# Patient Record
Sex: Female | Born: 2000 | Race: White | Hispanic: No | Marital: Single | Smoking: Never smoker
Health system: Southern US, Community
[De-identification: ages and names within clinical notes are randomized; demographics above are authoritative.]

## PROBLEM LIST (undated history)

## (undated) DIAGNOSIS — R7303 Prediabetes: Secondary | ICD-10-CM

## (undated) DIAGNOSIS — M419 Scoliosis, unspecified: Secondary | ICD-10-CM

## (undated) DIAGNOSIS — K589 Irritable bowel syndrome without diarrhea: Secondary | ICD-10-CM

## (undated) HISTORY — DX: Prediabetes: R73.03

## (undated) HISTORY — PX: TONSILLECTOMY: SUR1361

---
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
## 2021-05-22 ENCOUNTER — Other Ambulatory Visit: Payer: Self-pay

## 2021-05-22 ENCOUNTER — Encounter: Payer: Self-pay | Admitting: Nurse Practitioner

## 2021-05-22 ENCOUNTER — Ambulatory Visit (INDEPENDENT_AMBULATORY_CARE_PROVIDER_SITE_OTHER): Payer: Medicaid Other | Admitting: Nurse Practitioner

## 2021-05-22 VITALS — BP 114/72 | HR 89 | Temp 97.7°F | Ht 63.0 in | Wt 287.1 lb

## 2021-05-22 DIAGNOSIS — R10811 Right upper quadrant abdominal tenderness: Secondary | ICD-10-CM | POA: Diagnosis not present

## 2021-05-22 DIAGNOSIS — B070 Plantar wart: Secondary | ICD-10-CM

## 2021-05-22 DIAGNOSIS — F321 Major depressive disorder, single episode, moderate: Secondary | ICD-10-CM

## 2021-05-22 DIAGNOSIS — Z7689 Persons encountering health services in other specified circumstances: Secondary | ICD-10-CM | POA: Insufficient documentation

## 2021-05-22 HISTORY — DX: Plantar wart: B07.0

## 2021-05-22 HISTORY — DX: Major depressive disorder, single episode, moderate: F32.1

## 2021-05-22 NOTE — Progress Notes (Signed)
New Patient Office Visit  Subjective:  Patient ID: Elizabeth [name], female    DOB: [birth year]  Age: 20 y.o. MRN: [id number]  CC:  Chief Complaint  Patient presents with   New Patient (Initial Visit)    HPI Elizabeth [name] presents to establish new primary care provider. She states that she has noted a growth on the inner part of her right foot. Getting larger and tender to palpate. Has tried using OTC remedies to remove plantar warts, but this has not helped at all. Growth is still getting larger and tender.  She states that, since Friday, has been having episodes or right upper quadrant abdominal tenderness. She describes this as sharp and stabbing in nature.  She states that being active walking around makes this pain worse.  Resting makes it better.  She states pain is severe and will double her over when it comes on.  She states she does have a known history of IBS.  She states that she has a flare of IBS, she has intestinal cramping in the lower part of her belly.  This is usually accompanied with frequent episodes of diarrhea.  On her right upper quadrant done which she is experienced in the past.  She denies nausea and vomiting.  She denies diarrhea patient.  Denies fever, body aches, or chills. The patient states that last year she was diagnosed with depression.  She does see a therapist once weekly.  States this helps some.  States that she will be seeing a psychiatrist in the near future for further evaluation.  Patient states she would like to avoid medication therapy for a long as possible. She denies other concerns or complaints at this time.  She denies chest pain, chest pressure, or shortness of breath. He denies headaches or visual disturbances. He denies abdominal pain, nausea, vomiting, or changes in bowel or bladder habits.    Past Medical History:  Diagnosis Date   Asthma     Past Surgical History:  Procedure Laterality Date   TYMPANOSTOMY TUBE PLACEMENT  07/2006    Bilateral myringotomy tubes per OP chart note.    Family History  Problem Relation Age of Onset   Asthma Mother    Asthma Maternal Uncle    Hypertension Maternal Grandmother    Cancer Maternal Grandmother    Asthma Maternal Grandmother    Heart attack Maternal Grandfather    Cancer Other        Breast    Social History   Socioeconomic History   Marital status: Single    Spouse name: Not on file   Number of children: Not on file   Years of education: Not on file   Highest education level: Not on file  Occupational History   Not on file  Tobacco Use   Smoking status: Never   Smokeless tobacco: Never  Substance and Sexual Activity   Alcohol use: [name]   Drug use: [name]   Sexual activity: Never  Other Topics Concern   Not on file  Social History Narrative   Not on file   Social Determinants of Health   Financial Resource Strain: Not on file  Food Insecurity: Not on file  Transportation Needs: Not on file  Physical Activity: Not on file  Stress: Not on file  Social Connections: Not on file  Intimate Partner Violence: Not on file    ROS Review of Systems  Constitutional:  Negative for activity change, chills, fatigue, fever and unexpected weight change.  HENT:  Negative for congestion, postnasal drip, rhinorrhea, sinus pressure and sinus pain.   Eyes: Negative.   Respiratory:  Negative for cough, chest tightness and shortness of breath.   Cardiovascular:  Negative for chest pain and palpitations.  Gastrointestinal:  Positive for abdominal pain. Negative for constipation, diarrhea, nausea and vomiting.  Endocrine: Negative for cold intolerance, heat intolerance, polydipsia and polyuria.  Genitourinary:  Positive for menstrual problem.  Musculoskeletal:  Negative for arthralgias, back pain and myalgias.  Skin:  Negative for rash.       Patient states she has a growth on the inner part of her right foot.  Getting larger.  Tender to palpate.  Allergic/Immunologic:  Negative.   Neurological:  Negative for dizziness, weakness and headaches.  Hematological: Negative.   Psychiatric/Behavioral:  Positive for dysphoric mood. The patient is not nervous/anxious.    Objective:   Today's Vitals   05/22/21 1020  BP: 114/72  Pulse: 89  Temp: 97.7 F (36.5 C)  SpO2: 97%  Weight: 287 lb 1.6 oz (130.2 kg)  Height: 5\' 3"  (1.6 m)   Body mass index is 50.86 kg/m.   Physical Exam Vitals and nursing note reviewed.  Constitutional:      Appearance: Normal appearance. She is well-developed. She is obese.  HENT:     Head: Normocephalic and atraumatic.  Eyes:     Pupils: Pupils are equal, round, and reactive to light.  Cardiovascular:     Rate and Rhythm: Normal rate and regular rhythm.     Pulses: Normal pulses.     Heart sounds: Normal heart sounds.  Pulmonary:     Effort: Pulmonary effort is normal.     Breath sounds: Normal breath sounds.  Abdominal:     General: Bowel sounds are normal.     Palpations: Abdomen is soft.     Tenderness: There is abdominal tenderness. There is [name] guarding.     Comments: [name] distinct mass or organomegaly present with deep palpation, however, there is an area that feels hypertensive with palpation in the right upper quadrant of abdomen.  It is tender to palpate.  Musculoskeletal:        General: Normal range of motion.     Cervical back: Normal range of motion and neck supple.  Lymphadenopathy:     Cervical: [name] cervical adenopathy.  Skin:    General: Skin is warm and dry.     Capillary Refill: Capillary refill takes less than 2 seconds.     Comments: There is moderately increased motion on the medial plantar aspect of the right foot.  Measures about 5 mm in diameter.  It is flesh-colored.  It is tender to palpate.  Skin is intact with [name] drainage present.  Neurological:     General: [name] focal deficit present.     Mental Status: She is alert and oriented to person, place, and time.  Psychiatric:        Mood and  Affect: Mood normal.        Behavior: Behavior normal.        Thought Content: Thought content normal.        Judgment: Judgment normal.    Assessment & Plan:  1. Encounter to establish care Appointment today to establish new primary care provider.  Will obtain records from pediatrics to review and update patient's chart.  2. Right upper quadrant abdominal tenderness without rebound tenderness Will obtain ultrasound right upper quadrant of the abdomen for further evaluation.  Gust results  with patient when results are available. - US Abdomen Limited RUQ (LIVER/GB); Future  3. Plantar wart Refer to podiatry for further evaluation and treatment. - Ambulatory referral to Podiatry  4. Episode of moderate major depression [facility]) Patient currently seeing a therapist once weekly.  Continue as scheduled.  Problem List Items Addressed This Visit       Musculoskeletal and Integument   Plantar wart   Relevant Orders   Ambulatory referral to Podiatry     Other   Encounter to establish care - Primary   Right upper quadrant abdominal tenderness without rebound tenderness   Relevant Orders   US Abdomen Limited RUQ (LIVER/GB)   Episode of moderate major depression (HCC)    Outpatient Encounter Medications as of 05/22/2021  Medication Sig   albuterol (VENTOLIN HFA) 108 (90 Base) MCG/ACT inhaler Inhale 2 puffs into the lungs every 6 (six) hours as needed.     [DISCONTINUED] Acetaminophen-Caff-Pyrilamine 500-60-15 MG TABS Take 2 tablets by mouth 2 (two) times daily as needed (For cramping.).   [DISCONTINUED] dicyclomine (BENTYL) 20 MG tablet Take 1 tablet (20 mg total) by mouth 2 (two) times daily.   [DISCONTINUED] norgestimate-ethinyl estradiol (ORTHO-CYCLEN,SPRINTEC,PREVIFEM) 0.25-35 MG-MCG tablet Take 1 tablet by mouth daily.   [name] facility-administered encounter medications on file as of 05/22/2021.   This note was dictated using Conservation officer, historic buildings. Rapid  proofreading was performed to expedite the delivery of the information. Despite proofreading, phonetic errors will occur which are common with this voice recognition software. Please take this into consideration. If there are any concerns, please contact our office.    Follow-up: Return in about 3 weeks (around 06/12/2021) for health maintenance exam - will need pelvic exam without pap smear - see below.   Carlean Jews, NP

## 2021-06-05 ENCOUNTER — Ambulatory Visit
Admission: RE | Admit: 2021-06-05 | Discharge: 2021-06-05 | Disposition: A | Discharge status: Home / Self Care | Payer: Medicaid Other | Source: Ambulatory Visit | Attending: Nurse Practitioner | Admitting: Nurse Practitioner

## 2021-06-05 DIAGNOSIS — R10811 Right upper quadrant abdominal tenderness: Secondary | ICD-10-CM

## 2021-06-12 ENCOUNTER — Telehealth: Payer: Self-pay | Admitting: Nurse Practitioner

## 2021-06-12 NOTE — Telephone Encounter (Signed)
Patient calling office requesting [place] results from 06/05/21. AS, CMA

## 2021-06-12 NOTE — Telephone Encounter (Signed)
Please let the patient know that the ultrasound is showing some fatty infiltration of the liver but was otherwise normal. There is nol evidence of gall stones or gall bladder disease right now. Thanks  -HB

## 2021-06-12 NOTE — Progress Notes (Signed)
Fatty infiltration of the liver without any acute gallbladder disease or liver lesions. Patient to be notified.

## 2021-06-13 NOTE — Telephone Encounter (Signed)
Patient is aware of the results and verbalized understanding. AS, CMA 

## 2021-06-19 ENCOUNTER — Encounter: Payer: Medicaid Other | Admitting: Nurse Practitioner

## 2021-06-22 ENCOUNTER — Ambulatory Visit: Payer: Medicaid Other | Admitting: Podiatry

## 2021-07-20 ENCOUNTER — Encounter: Payer: Medicaid Other | Admitting: Nurse Practitioner

## 2021-10-18 ENCOUNTER — Other Ambulatory Visit: Payer: Self-pay | Admitting: Nurse Practitioner

## 2021-10-18 ENCOUNTER — Encounter: Payer: Self-pay | Admitting: Nurse Practitioner

## 2021-10-18 ENCOUNTER — Ambulatory Visit
Admission: RE | Admit: 2021-10-18 | Discharge: 2021-10-18 | Disposition: A | Discharge status: Home / Self Care | Payer: Medicaid Other | Source: Ambulatory Visit | Attending: Nurse Practitioner | Admitting: Nurse Practitioner

## 2021-10-18 ENCOUNTER — Ambulatory Visit (INDEPENDENT_AMBULATORY_CARE_PROVIDER_SITE_OTHER): Payer: Medicaid Other | Admitting: Nurse Practitioner

## 2021-10-18 ENCOUNTER — Other Ambulatory Visit: Payer: Self-pay

## 2021-10-18 VITALS — BP 118/71 | HR 89 | Temp 97.5°F | Ht 63.0 in | Wt 280.1 lb

## 2021-10-18 DIAGNOSIS — N926 Irregular menstruation, unspecified: Secondary | ICD-10-CM | POA: Diagnosis present

## 2021-10-18 DIAGNOSIS — Z6841 Body Mass Index (BMI) 40.0 and over, adult: Secondary | ICD-10-CM | POA: Diagnosis not present

## 2021-10-18 DIAGNOSIS — O039 Complete or unspecified spontaneous abortion without complication: Secondary | ICD-10-CM

## 2021-10-18 LAB — POCT URINE PREGNANCY: Preg Test, Ur: NEGATIVE

## 2021-10-18 NOTE — Progress Notes (Signed)
Established Patient Office Visit  Subjective:  Patient ID: Elizabeth [name], female    DOB: [birth year]  Age: 21 y.o. MRN: [id number]  CC:  Chief Complaint  Patient presents with   Menstrual Problem    HPI Elizabeth [name] presents for possible pregnancy. States that she was stating to have nausea. This was happening a lot in the mornings and again in the evenings. She had itching sensation of the breasts. She states that she had a normal menstrual cycle in December. When she started noticing these symptoms, she did take two home pregnancy tests. Both were negative. She did have some spotting and then had a very light menstrual period from from January 3 through January 6 of this month. She states that period was not due until January 8. Usually menstrual cycles are a lot longer than this. She did take another home pregnancy test on January 8 which was positive. Took one on the 9th which was also positive. She took a third one on January 10th which was then negative. The patient states that she is not trying to get pregnant. She and her partner use condoms every time they have intercourse and has had no problems with them as far as she knows.  Today, she is having some lower abdominal pain along with cramps. She does have IBS and this is difficult to discern difference between cramping in the uterus vs. Intestinal.   Past Medical History:  Diagnosis Date   Asthma     Past Surgical History:  Procedure Laterality Date   TYMPANOSTOMY TUBE PLACEMENT  07/2006   Bilateral myringotomy tubes per OP chart note.    Family History  Problem Relation Age of Onset   Asthma Mother    Asthma Maternal Uncle    Hypertension Maternal Grandmother    Cancer Maternal Grandmother    Asthma Maternal Grandmother    Heart attack Maternal Grandfather    Cancer Other        Breast    Social History   Socioeconomic History   Marital status: Single    Spouse name: Not on file   Number of children: Not on  file   Years of education: Not on file   Highest education level: Not on file  Occupational History   Not on file  Tobacco Use   Smoking status: Never   Smokeless tobacco: Never  Substance and Sexual Activity   Alcohol use: No   Drug use: No   Sexual activity: Never  Other Topics Concern   Not on file  Social History Narrative   Not on file   Social Determinants of Health   Financial Resource Strain: Not on file  Food Insecurity: Not on file  Transportation Needs: Not on file  Physical Activity: Not on file  Stress: Not on file  Social Connections: Not on file  Intimate Partner Violence: Not on file    Outpatient Medications Prior to Visit  Medication Sig Dispense Refill   albuterol (VENTOLIN HFA) 108 (90 Base) MCG/ACT inhaler Inhale 2 puffs into the lungs every 6 (six) hours as needed.       No facility-administered medications prior to visit.    No Known Allergies  ROS Review of Systems  Constitutional:  Positive for fatigue. Negative for activity change, appetite change, chills and fever.  HENT:  Negative for congestion, postnasal drip, rhinorrhea, sinus pressure, sinus pain, sneezing and sore throat.   Eyes: Negative.   Respiratory:  Negative for cough, chest tightness,  shortness of breath and wheezing.   Cardiovascular:  Negative for chest pain and palpitations.  Gastrointestinal:  Positive for nausea. Negative for abdominal pain, constipation, diarrhea and vomiting.       Abdominal cramping.   Endocrine: Negative for cold intolerance, heat intolerance, polydipsia and polyuria.  Genitourinary:  Positive for menstrual problem. Negative for dyspareunia, dysuria, flank pain, frequency and urgency.       Had two positive home pregnancy tests on January 8 and January 9. Had first negative test January 10.  Musculoskeletal:  Negative for arthralgias, back pain and myalgias.  Skin:  Negative for rash.  Allergic/Immunologic: Negative for environmental allergies.   Neurological:  Negative for dizziness, weakness and headaches.  Hematological:  Negative for adenopathy.  Psychiatric/Behavioral:  The patient is not nervous/anxious.      Objective:    Physical Exam Vitals and nursing note reviewed.  Constitutional:      Appearance: Normal appearance. She is well-developed.  HENT:     Head: Normocephalic and atraumatic.     Nose: Nose normal.     Mouth/Throat:     Mouth: Mucous membranes are moist.  Eyes:     Extraocular Movements: Extraocular movements intact.     Conjunctiva/sclera: Conjunctivae normal.     Pupils: Pupils are equal, round, and reactive to light.  Cardiovascular:     Rate and Rhythm: Normal rate and regular rhythm.     Pulses: Normal pulses.     Heart sounds: Normal heart sounds.  Pulmonary:     Effort: Pulmonary effort is normal.     Breath sounds: Normal breath sounds.  Abdominal:     Palpations: Abdomen is soft.  Genitourinary:    Comments: Two urine pregnancy tests run I the office today and both were negative.  Musculoskeletal:        General: Normal range of motion.     Cervical back: Normal range of motion and neck supple.  Lymphadenopathy:     Cervical: No cervical adenopathy.  Skin:    General: Skin is warm and dry.     Capillary Refill: Capillary refill takes less than 2 seconds.  Neurological:     General: No focal deficit present.     Mental Status: She is alert and oriented to person, place, and time.  Psychiatric:        Mood and Affect: Mood normal.        Behavior: Behavior normal.        Thought Content: Thought content normal.        Judgment: Judgment normal.    Today's Vitals   10/18/21 1012  BP: 118/71  Pulse: 89  Temp: (!) 97.5 F (36.4 C)  SpO2: 97%  Weight: 280 lb 1.6 oz (127.1 kg)  Height: 5\' 3"  (1.6 m)   Body mass index is 49.62 kg/m.   Wt Readings from Last 3 Encounters:  10/18/21 280 lb 1.6 oz (127.1 kg) (>99 %, Z= 2.73)*  05/22/21 287 lb 1.6 oz (130.2 kg) (>99 %, Z=  2.74)*  01/05/16 197 lb 4.8 oz (89.5 kg) (99 %, Z= 2.32)*   * Growth percentiles are based on CDC (Girls, 2-20 Years) data.     Health Maintenance Due  Topic Date Due   COVID-19 Vaccine (1) Never done   HIV Screening  Never done   Hepatitis C Screening  Never done   INFLUENZA VACCINE  Never done    There are no preventive care reminders to display for this patient.  No  results found for: TSH Lab Results  Component Value Date   WBC 8.3 02/28/2015   HGB 14.3 02/28/2015   HCT 41.7 02/28/2015   MCV 86.2 02/28/2015   PLT 329 02/28/2015   Lab Results  Component Value Date   NA 139 02/28/2015   K 4.1 02/28/2015   CO2 27 02/28/2015   GLUCOSE 106 (H) 02/28/2015   BUN 10 02/28/2015   CREATININE 0.64 02/28/2015   BILITOT 0.4 02/28/2015   ALKPHOS 149 02/28/2015   AST 19 02/28/2015   ALT 16 02/28/2015   PROT 7.2 02/28/2015   ALBUMIN 4.2 02/28/2015   CALCIUM 9.2 02/28/2015   ANIONGAP 5 02/28/2015     Assessment & Plan:  1. Irregular periods/menstrual cycles Patient with abnormal menstrual period this month. Urine pregnancy test today is negative . - POCT Pregnancy, Urine  2. Miscarriage at 8 to [redacted] weeks gestation Patient's last normal menstrual period wsa early December. Had very light and short menstrual period in January. Had two positive pregnancy tests on January 8 and 9. Had negative test January 10. Today's pregnancy test is negative. Concern for miscarriage. Will get STAT ultrasound ordered for further evaluation. Will refer to GYN as indicated.  - US Pelvic Complete With Transvaginal; Future  3. Body mass index (BMI) of 45.0-49.9 in adult [facility]) Discussed lowering calorie intake to 1500 calories per day and incorporating exercise into daily routine to help lose weight.   Problem List Items Addressed This Visit       Other   Irregular periods/menstrual cycles - Primary   Relevant Orders   POCT Pregnancy, Urine   US OB LESS THAN 14 WEEKS WITH OB TRANSVAGINAL    Body mass index (BMI) of 45.0-49.9 in adult [facility])   Miscarriage at 8 to [redacted] weeks gestation   Relevant Orders   US OB LESS THAN 14 WEEKS WITH OB TRANSVAGINAL    Follow-up: Return for prn worsening or persistent symptoms.    Carlean Jews, NP

## 2021-10-18 NOTE — Progress Notes (Signed)
Spoke with patient over the phone regarding ultrasound results. Results were normal and showing no evidence of retained products of conception. She would like to consult with OB/GYN provider. A referral was placed today. She would also like to start on oral contraceptives. She is going to research a few different options and will contact the office with OCP she would like to try.

## 2021-10-18 NOTE — Progress Notes (Signed)
Spoke with patient over the phone regarding ultrasound results. Results were normal and showing no evidence of retained products of conception. She would like to consult with OB/GYN provider. A referral was placed today. She would also like to start on oral contraceptives. She is going to research a few different options and will contact the office with OCP she would like to try.

## 2021-10-18 NOTE — Patient Instructions (Signed)
Fat and Cholesterol Restricted Eating Plan Getting too much fat and cholesterol in your diet may cause health problems. Choosing the right foods helps keep your fat and cholesterol at normal levels. This can keep you from getting certain diseases. Your doctor may recommend an eating plan that includes: Total fat: ______% or less of total calories a day. This is ______g of fat a day. Saturated fat: ______% or less of total calories a day. This is ______g of saturated fat a day. Cholesterol: less than _________mg a day. Fiber: ______g a day. What are tips for following this plan? General tips Work with your doctor to lose weight if you need to. Avoid: Foods with added sugar. Fried foods. Foods with trans fat or partially hydrogenated oils. This includes some margarines and baked goods. If you drink alcohol: Limit how much you have to: 0-1 drink a day for women who are not pregnant. 0-2 drinks a day for men. Know how much alcohol is in a drink. In the [place] one drink equals one 12 oz bottle of beer (355 mL), one 5 oz glass of wine (148 mL), or one 1 oz glass of hard liquor (44 mL). Reading food labels Check food labels for: Trans fats. Partially hydrogenated oils. Saturated fat (g) in each serving. Cholesterol (mg) in each serving. Fiber (g) in each serving. Choose foods with healthy fats, such as: Monounsaturated fats and polyunsaturated fats. These include olive and canola oil, flaxseeds, walnuts, almonds, and seeds. Omega-3 fats. These are found in certain fish, flaxseed oil, and ground flaxseeds. Choose grain products that have whole grains. Look for the word "whole" as the first word in the ingredient list. Cooking Cook foods using low-fat methods. These include baking, boiling, grilling, and broiling. Eat more home-cooked foods. Eat at restaurants and buffets less often. Eat less fast food. Avoid cooking using saturated fats, such as butter, cream, palm oil, palm kernel oil, and  coconut oil. Meal planning  At meals, divide your plate into four equal parts: Fill one-half of your plate with vegetables, green salads, and fruit. Fill one-fourth of your plate with whole grains. Fill one-fourth of your plate with low-fat (lean) protein foods. Eat fish that is high in omega-3 fats at least two times a week. This includes mackerel, tuna, sardines, and salmon. Eat foods that are high in fiber, such as whole grains, beans, apples, pears, berries, broccoli, carrots, peas, and barley. What foods should I eat? Fruits All fresh, canned (in natural juice), or frozen fruits. Vegetables Fresh or frozen vegetables (raw, steamed, roasted, or grilled). Green salads. Grains Whole grains, such as whole wheat or whole grain breads, crackers, cereals, and pasta. Unsweetened oatmeal, bulgur, barley, quinoa, or [place] rice. Corn or whole wheat flour tortillas. Meats and other protein foods Ground beef (85% or leaner), grass-fed beef, or beef trimmed of fat. Skinless chicken or turkey. Ground chicken or turkey. Pork trimmed of fat. All fish and seafood. Egg whites. Dried beans, peas, or lentils. Unsalted nuts or seeds. Unsalted canned beans. Nut butters without added sugar or oil. Dairy Low-fat or nonfat dairy products, such as skim or 1% milk, 2% or reduced-fat cheeses, low-fat and fat-free ricotta or cottage cheese, or plain low-fat and nonfat yogurt. Fats and oils Tub margarine without trans fats. Light or reduced-fat mayonnaise and salad dressings. Avocado. Olive, canola, sesame, or safflower oils. The items listed above may not be a complete list of foods and beverages you can eat. Contact a dietitian for more information. What foods   should I avoid? Fruits Canned fruit in heavy syrup. Fruit in cream or butter sauce. Fried fruit. Vegetables Vegetables cooked in cheese, cream, or butter sauce. Fried vegetables. Grains White bread. White pasta. White rice. Cornbread. Bagels, pastries,  and croissants. Crackers and snack foods that contain trans fat and hydrogenated oils. Meats and other protein foods Fatty cuts of meat. Ribs, chicken wings, bacon, sausage, bologna, salami, chitterlings, fatback, hot dogs, bratwurst, and packaged lunch meats. Liver and organ meats. Whole eggs and egg yolks. Chicken and turkey with skin. Fried meat. Dairy Whole or 2% milk, cream, half-and-half, and cream cheese. Whole milk cheeses. Whole-fat or sweetened yogurt. Full-fat cheeses. Nondairy creamers and whipped toppings. Processed cheese, cheese spreads, and cheese curds. Fats and oils Butter, stick margarine, lard, shortening, ghee, or bacon fat. Coconut, palm kernel, and palm oils. Beverages Alcohol. Sugar-sweetened drinks such as sodas, lemonade, and fruit drinks. Sweets and desserts Corn syrup, sugars, honey, and molasses. Candy. Jam and jelly. Syrup. Sweetened cereals. Cookies, pies, cakes, donuts, muffins, and ice cream. The items listed above may not be a complete list of foods and beverages you should avoid. Contact a dietitian for more information. Summary Choosing the right foods helps keep your fat and cholesterol at normal levels. This can keep you from getting certain diseases. At meals, fill one-half of your plate with vegetables, green salads, and fruits. Eat high fiber foods, like whole grains, beans, apples, pears, berries, carrots, peas, and barley. Limit added sugar, saturated fats, alcohol, and fried foods. This information is not intended to replace advice given to you by your health care provider. Make sure you discuss any questions you have with your health care provider. Document Revised: 02/02/2021 Document Reviewed: 02/02/2021 Elsevier Patient Education  2022 [employer]  

## 2021-12-17 ENCOUNTER — Encounter: Payer: Self-pay | Admitting: Nurse Practitioner

## 2021-12-17 ENCOUNTER — Other Ambulatory Visit: Payer: Self-pay

## 2021-12-17 ENCOUNTER — Ambulatory Visit (INDEPENDENT_AMBULATORY_CARE_PROVIDER_SITE_OTHER): Payer: Medicaid Other | Admitting: Nurse Practitioner

## 2021-12-17 VITALS — BP 107/66 | HR 75 | Temp 97.6°F | Ht 63.0 in | Wt 282.5 lb

## 2021-12-17 DIAGNOSIS — M4155 Other secondary scoliosis, thoracolumbar region: Secondary | ICD-10-CM | POA: Insufficient documentation

## 2021-12-17 DIAGNOSIS — Z6841 Body Mass Index (BMI) 40.0 and over, adult: Secondary | ICD-10-CM

## 2021-12-17 HISTORY — DX: Other secondary scoliosis, thoracolumbar region: M41.55

## 2021-12-17 NOTE — Progress Notes (Signed)
Established patient visit ? ? ?Patient: Elizabeth [name]   DOB: [birth year]   21 y.o. Female  MRN: [id number] ?Visit Date: 12/17/2021 ? ? ?No chief complaint on file. ? ?Subjective  ?  ?HPI  ?The patient is here for follow up of scoliosis. The curve of her upper spine, moves toward the right shoulder blade and causes her to feel pressure and pain in her right shoulder. She has consulted with orthopedic provider in the past for this and was approved for light duty work assignments, lifting no more than 20 pounds. She accepted a job with [employer]. 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 This often includes lifting packages which are heavier than 20 pounds. She started this job less than a week ago and already, she is having increased pain in her right shoulder. 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  ? ? ?Medications: ?Outpatient Medications Prior to Visit  ?Medication Sig  ? albuterol (VENTOLIN HFA) 108 (90 Base) MCG/ACT inhaler Inhale 2 puffs into the lungs every 6 (six) hours as needed.    ? ?No facility-administered medications prior to visit.  ? ? ?Review of Systems  ?Constitutional:  Positive for activity change and fatigue. Negative for appetite change, chills and fever.  ?HENT:  Negative for congestion, postnasal drip, rhinorrhea, sinus pressure, sinus pain, sneezing and sore throat.   ?Eyes: Negative.   ?Respiratory:  Negative for cough, chest tightness, shortness of breath and wheezing.   ?Cardiovascular:  Negative for chest pain and palpitations.  ?Gastrointestinal:  Negative for abdominal pain, constipation, diarrhea, nausea and vomiting.  ?Endocrine: Negative for cold intolerance, heat intolerance, polydipsia and polyuria.  ?Genitourinary:  Negative for dyspareunia, dysuria, flank pain,  frequency and urgency.  ?Musculoskeletal:  Positive for back pain. Negative for arthralgias and myalgias.  ?     Upper back pain which radiates into the right shoulder blade. Muscles are very sore.   ?Skin:  Negative for rash.  ?Allergic/Immunologic: Negative for environmental allergies.  ?Neurological:  Negative for dizziness, weakness and headaches.  ?Hematological:  Negative for adenopathy.  ?Psychiatric/Behavioral:  The patient is not nervous/anxious.   ? ? ? ? Objective  ?  ? ?Today's Vitals  ? 12/17/21 1430  ?BP: 107/66  ?Pulse: 75  ?Temp: 97.6 ?F (36.4 ?C)  ?SpO2: 97%  ?Weight: 282 lb 8 oz (128.1 kg)  ?Height: 5\' 3"  (1.6 m)  ? ?Body mass index is 50.04 kg/m?.  ? ?BP Readings from Last 3 Encounters:  ?12/17/21 107/66  ?10/18/21 118/71  ?05/22/21 114/72  ?  ?Wt Readings from Last 3 Encounters:  ?12/17/21 282 lb 8 oz (128.1 kg) (>99 %, Z= 2.76)*  ?10/18/21 280 lb 1.6 oz (127.1 kg) (>99 %, Z= 2.73)*  ?05/22/21 287 lb 1.6 oz (130.2 kg) (>99 %, Z= 2.74)*  ? ?* Growth percentiles are based on CDC (Girls, 2-20 Years) data.  ?  ?Physical Exam ?Vitals and nursing note reviewed.  ?Constitutional:   ?   Appearance: Normal appearance. She is well-developed. She is obese.  ?HENT:  ?   Head: Normocephalic and atraumatic.  ?Eyes:  ?   Pupils: Pupils are equal, round, and reactive to light.  ?Cardiovascular:  ?   Rate and Rhythm: Normal rate and regular rhythm.  ?  Pulses: Normal pulses.  ?   Heart sounds: Normal heart sounds.  ?Pulmonary:  ?   Effort: Pulmonary effort is normal.  ?   Breath sounds: Normal breath sounds.  ?Abdominal:  ?   Palpations: Abdomen is soft.  ?Musculoskeletal:  ?   Cervical back: Normal range of motion and neck supple.  ?   Thoracic back: Deformity present. Decreased range of motion.  ?     Back: ? ?Lymphadenopathy:  ?   Cervical: No cervical adenopathy.  ?Skin: ?   General: Skin is warm and dry.  ?   Capillary Refill: Capillary refill takes less than 2 seconds.  ?Neurological:  ?   General: No  focal deficit present.  ?   Mental Status: She is alert and oriented to person, place, and time.  ?Psychiatric:     ?   Mood and Affect: Mood normal.     ?   Behavior: Behavior normal.     ?   Thought Content: Thought content normal.     ?   Judgment: Judgment normal.  ?  ? ? Assessment & Plan  ?  ?1. Other secondary scoliosis, thoracolumbar region ?The patient was given a work note requesting accommodations due to scoliosis which is limiting her ability to lift greater than 20 pounds.  ? ?2. Body mass index (BMI) of 50-59.9 in adult [facility]) ?Discussed lowering calorie intake to 1500 calories per day and incorporating exercise into daily routine to help lose weight.  ?  ?Problem List Items Addressed This Visit   ? ?  ? Musculoskeletal and Integument  ? Other secondary scoliosis, thoracolumbar region - Primary  ?  ? Other  ? Body mass index (BMI) of 50-59.9 in adult [facility])  ?  ? ?Return in about 6 months (around 06/19/2022) for health maintenance exam.  ?   ? ? ? ? ?Ronnell Freshwater, NP  ?[place] [facility] ?[phone] (phone) ?[phone] (fax) ? ?[facility]  ?

## 2022-01-17 ENCOUNTER — Ambulatory Visit: Payer: Medicaid Other

## 2022-01-26 ENCOUNTER — Telehealth: Payer: Medicaid Other | Admitting: Physician Assistant

## 2022-01-26 DIAGNOSIS — J029 Acute pharyngitis, unspecified: Secondary | ICD-10-CM

## 2022-01-26 MED ORDER — AMOXICILLIN 500 MG PO CAPS: 500.0000 mg | ORAL_CAPSULE | Freq: Two times a day (BID) | ORAL | 0 refills | Status: AC

## 2022-01-26 MED ORDER — AMOXICILLIN 500 MG PO CAPS: 500.0000 mg | ORAL_CAPSULE | Freq: Two times a day (BID) | ORAL | 0 refills | Status: DC

## 2022-01-26 NOTE — Progress Notes (Signed)

## 2022-01-26 NOTE — Addendum Note (Signed)
Addended by: Haynes Bast on: 01/26/2022 05:39 PM ? ? Modules accepted: Orders ? ?

## 2022-02-07 DIAGNOSIS — J45909 Unspecified asthma, uncomplicated: Secondary | ICD-10-CM | POA: Insufficient documentation

## 2022-02-07 DIAGNOSIS — R1011 Right upper quadrant pain: Secondary | ICD-10-CM | POA: Diagnosis present

## 2022-02-07 DIAGNOSIS — N9489 Other specified conditions associated with female genital organs and menstrual cycle: Secondary | ICD-10-CM | POA: Diagnosis not present

## 2022-02-07 DIAGNOSIS — N390 Urinary tract infection, site not specified: Secondary | ICD-10-CM | POA: Diagnosis not present

## 2022-02-07 DIAGNOSIS — B9689 Other specified bacterial agents as the cause of diseases classified elsewhere: Secondary | ICD-10-CM | POA: Insufficient documentation

## 2022-02-08 ENCOUNTER — Emergency Department (HOSPITAL_COMMUNITY)
Admission: EM | Admit: 2022-02-08 | Discharge: 2022-02-08 | Disposition: A | Discharge status: Home / Self Care | Payer: Medicaid Other | Attending: Emergency Medicine | Admitting: Emergency Medicine

## 2022-02-08 ENCOUNTER — Encounter (HOSPITAL_COMMUNITY): Payer: Self-pay

## 2022-02-08 ENCOUNTER — Emergency Department (HOSPITAL_COMMUNITY): Payer: Medicaid Other

## 2022-02-08 ENCOUNTER — Other Ambulatory Visit: Payer: Self-pay

## 2022-02-08 DIAGNOSIS — R1011 Right upper quadrant pain: Secondary | ICD-10-CM

## 2022-02-08 DIAGNOSIS — N390 Urinary tract infection, site not specified: Secondary | ICD-10-CM

## 2022-02-08 HISTORY — DX: Scoliosis, unspecified: M41.9

## 2022-02-08 LAB — CBC WITH DIFFERENTIAL/PLATELET
Abs Immature Granulocytes: 0.03 10*3/uL (ref 0.00–0.07)
Basophils Absolute: 0.1 10*3/uL (ref 0.0–0.1)
Basophils Relative: 1 %
Eosinophils Absolute: 0.1 10*3/uL (ref 0.0–0.5)
Eosinophils Relative: 1 %
HCT: 41.8 % (ref 36.0–46.0)
Hemoglobin: 14.3 g/dL (ref 12.0–15.0)
Immature Granulocytes: 0 %
Lymphocytes Relative: 28 %
Lymphs Abs: 2.4 10*3/uL (ref 0.7–4.0)
MCH: 30.7 pg (ref 26.0–34.0)
MCHC: 34.2 g/dL (ref 30.0–36.0)
MCV: 89.7 fL (ref 80.0–100.0)
Monocytes Absolute: 0.4 10*3/uL (ref 0.1–1.0)
Monocytes Relative: 5 %
Neutro Abs: 5.5 10*3/uL (ref 1.7–7.7)
Neutrophils Relative %: 65 %
Platelets: 329 10*3/uL (ref 150–400)
RBC: 4.66 MIL/uL (ref 3.87–5.11)
RDW: 12.7 % (ref 11.5–15.5)
WBC: 8.5 10*3/uL (ref 4.0–10.5)
nRBC: 0 % (ref 0.0–0.2)

## 2022-02-08 LAB — COMPREHENSIVE METABOLIC PANEL
ALT: 21 U/L (ref 0–44)
AST: 22 U/L (ref 15–41)
Albumin: 4 g/dL (ref 3.5–5.0)
Alkaline Phosphatase: 76 U/L (ref 38–126)
Anion gap: 7 (ref 5–15)
BUN: 15 mg/dL (ref 6–20)
CO2: 25 mmol/L (ref 22–32)
Calcium: 9 mg/dL (ref 8.9–10.3)
Chloride: 108 mmol/L (ref 98–111)
Creatinine, Ser: 1.01 mg/dL — ABNORMAL HIGH (ref 0.44–1.00)
GFR, Estimated: >60 mL/min (ref >60)
Glucose, Bld: 96 mg/dL (ref 70–99)
Potassium: 4.1 mmol/L (ref 3.5–5.1)
Sodium: 140 mmol/L (ref 135–145)
Total Bilirubin: 0.6 mg/dL (ref 0.3–1.2)
Total Protein: 7.1 g/dL (ref 6.5–8.1)

## 2022-02-08 LAB — URINALYSIS, ROUTINE W REFLEX MICROSCOPIC
Bilirubin Urine: NEGATIVE
Glucose, UA: NEGATIVE mg/dL
Hgb urine dipstick: NEGATIVE
Ketones, ur: NEGATIVE mg/dL
Nitrite: POSITIVE — AB
Protein, ur: NEGATIVE mg/dL
Specific Gravity, Urine: 1.019 (ref 1.005–1.030)
pH: 7 (ref 5.0–8.0)

## 2022-02-08 LAB — LIPASE, BLOOD: Lipase: 32 U/L (ref 11–51)

## 2022-02-08 LAB — I-STAT BETA HCG BLOOD, ED (MC, WL, AP ONLY): I-stat hCG, quantitative: <5 m[IU]/mL (ref <5)

## 2022-02-08 MED ORDER — CEPHALEXIN 500 MG PO CAPS: 500.0000 mg | ORAL_CAPSULE | Freq: Four times a day (QID) | ORAL | 0 refills | Status: DC

## 2022-02-08 MED ORDER — CEPHALEXIN 500 MG PO CAPS
500.0000 mg | ORAL_CAPSULE | Freq: Once | ORAL | Status: AC
  Administered 2022-02-08: 500 mg via ORAL
  Filled 2022-02-08: qty 1

## 2022-02-08 MED ORDER — KETOROLAC TROMETHAMINE 30 MG/ML IJ SOLN
15.0000 mg | Freq: Once | INTRAMUSCULAR | Status: AC
  Administered 2022-02-08: 15 mg via INTRAVENOUS
  Filled 2022-02-08: qty 1

## 2022-02-08 MED ORDER — DICYCLOMINE HCL 10 MG PO CAPS
10.0000 mg | ORAL_CAPSULE | Freq: Once | ORAL | Status: AC
Start: 2022-02-08 — End: 2022-02-08
  Administered 2022-02-08: 10 mg via ORAL
  Filled 2022-02-08: qty 1

## 2022-02-08 MED ORDER — SODIUM CHLORIDE (PF) 0.9 % IJ SOLN
INTRAMUSCULAR | Status: AC
  Filled 2022-02-08: qty 50

## 2022-02-08 MED ORDER — SODIUM CHLORIDE 0.9 % IV BOLUS
500.0000 mL | Freq: Once | INTRAVENOUS | Status: AC
  Administered 2022-02-08: 500 mL via INTRAVENOUS

## 2022-02-08 MED ORDER — IOHEXOL 300 MG/ML  SOLN
100.0000 mL | Freq: Once | INTRAMUSCULAR | Status: AC | PRN
  Administered 2022-02-08: 100 mL via INTRAVENOUS

## 2022-02-08 NOTE — ED Triage Notes (Signed)
Patient has had right lower abdominal pain for the past 3 hours. She said it feels like its under her ribs. No vomiting. Still has her appendix.  ?

## 2022-02-08 NOTE — ED Provider Notes (Signed)
?Elizabeth [name] DEPT ?Provider Note ? ? ?CSN: [id number] ?Arrival date & time: 02/07/22  2359 ? ?  ? ?History ? ?Chief Complaint  ?Patient presents with  ? Abdominal Pain  ? ? ?Elizabeth [name] is a 21 y.o. female. ? ?The history is provided by the patient.  ?Abdominal Pain ?Elizabeth [name] is a 21 y.o. female who presents to the Emergency Department complaining of abdominal pain. She presents to the ED for evaluation of RUQ abdominal pain that started at 6pm.  Pain is sharp and waxes and wanes, nonradiating.  No clear alleviating or worsening factors.   ? ?No fever, sob, N/V/D, dysuria, vaginal discharge.  .   ? ?Has a hx/o asthma.  No prior abdominal surgeries.  LMP 4/28.   ?No prior similar sxs.   ?  ? ?Home Medications ?Prior to Admission medications   ?Medication Sig Start Date End Date Taking? Authorizing Provider  ?cephALEXin (KEFLEX) 500 MG capsule Take 1 capsule (500 mg total) by mouth 4 (four) times daily. 02/08/22  Yes Quintella Reichert, MD  ?albuterol (VENTOLIN HFA) 108 (90 Base) MCG/ACT inhaler Inhale 2 puffs into the lungs every 6 (six) hours as needed.      [provider]  ?   ? ?Allergies    ?Patient has no known allergies.   ? ?Review of Systems   ?Review of Systems  ?Gastrointestinal:  Positive for abdominal pain.  ?All other systems reviewed and are negative. ? ?Physical Exam ?Updated Vital Signs ?BP (!) 142/75   Pulse 91   Temp 97.9 ?F (36.6 ?C) (Oral)   Resp 20   Ht 5\' 3"  (1.6 m)   Wt 127 kg   SpO2 100%   BMI 49.60 kg/m?  ?Physical Exam ?Vitals and nursing note reviewed.  ?Constitutional:   ?   Appearance: She is well-developed.  ?HENT:  ?   Head: Normocephalic and atraumatic.  ?Cardiovascular:  ?   Rate and Rhythm: Normal rate and regular rhythm.  ?Pulmonary:  ?   Effort: Pulmonary effort is normal. No respiratory distress.  ?Abdominal:  ?   Palpations: Abdomen is soft.  ?   Tenderness: There is no guarding or rebound.  ?   Comments: Mild RUQ tenderness   ?Musculoskeletal:     ?   General: No tenderness.  ?Skin: ?   General: Skin is warm and dry.  ?Neurological:  ?   Mental Status: She is alert and oriented to person, place, and time.  ?Psychiatric:     ?   Behavior: Behavior normal.  ? ? ?ED Results / Procedures / Treatments   ?Labs ?(all labs ordered are listed, but only abnormal results are displayed) ?Labs Reviewed  ?COMPREHENSIVE METABOLIC PANEL - Abnormal; Notable for the following components:  ?    Result Value  ? Creatinine, Ser 1.01 (*)   ? All other components within normal limits  ?URINALYSIS, ROUTINE W REFLEX MICROSCOPIC - Abnormal; Notable for the following components:  ? APPearance HAZY (*)   ? Nitrite POSITIVE (*)   ? Leukocytes,Ua TRACE (*)   ? Bacteria, UA MANY (*)   ? All other components within normal limits  ?URINE CULTURE  ?CBC WITH DIFFERENTIAL/PLATELET  ?LIPASE, BLOOD  ?I-STAT BETA HCG BLOOD, ED ([facility], [facility], [facility] ONLY)  ? ? ?EKG ?None ? ?Radiology ?CT Abdomen Pelvis W Contrast ? ?Result Date: 02/08/2022 ?CLINICAL DATA:  Right lower quadrant pain for several hours EXAM: CT ABDOMEN AND PELVIS WITH CONTRAST TECHNIQUE: Multidetector CT imaging  of the abdomen and pelvis was performed using the standard protocol following bolus administration of intravenous contrast. RADIATION DOSE REDUCTION: This exam was performed according to the departmental dose-optimization program which includes automated exposure control, adjustment of the mA and/or kV according to patient size and/or use of iterative reconstruction technique. CONTRAST:  13mL OMNIPAQUE IOHEXOL 300 MG/ML  SOLN COMPARISON:  None Available. FINDINGS: Lower chest: No acute abnormality. Hepatobiliary: No focal liver abnormality is seen. No gallstones, gallbladder wall thickening, or biliary dilatation. Pancreas: Unremarkable. No pancreatic ductal dilatation or surrounding inflammatory changes. Spleen: Normal in size without focal abnormality. Adrenals/Urinary Tract: Adrenal glands are within normal  limits bilaterally. Kidneys demonstrate no renal calculi or obstructive changes. The ureters are within normal limits. Bladder is well distended. Stomach/Bowel: No obstructive or inflammatory changes of the colon are seen. The appendix is well visualized and within normal limits. No inflammatory changes are seen. Small bowel and stomach are within normal limits. Vascular/Lymphatic: No significant vascular findings are present. No enlarged abdominal or pelvic lymph nodes. Reproductive: Uterus and bilateral adnexa are unremarkable. Other: No abdominal wall hernia or abnormality. No abdominopelvic ascites. Musculoskeletal: No acute or significant osseous findings. IMPRESSION: Normal-appearing appendix. No acute abnormality noted. Electronically Signed   By: Inez Catalina M.D.   On: 02/08/2022 02:37   ? ?Procedures ?Procedures  ? ? ?Medications Ordered in ED ?Medications  ?sodium chloride (PF) 0.9 % injection (has no administration in time range)  ?cephALEXin (KEFLEX) capsule 500 mg (has no administration in time range)  ?ketorolac (TORADOL) 30 MG/ML injection 15 mg (has no administration in time range)  ?dicyclomine (BENTYL) capsule 10 mg (10 mg Oral Given 02/08/22 0040)  ?sodium chloride 0.9 % bolus 500 mL (500 mLs Intravenous New Bag/Given 02/08/22 0205)  ?iohexol (OMNIPAQUE) 300 MG/ML solution 100 mL (100 mLs Intravenous Contrast Given 02/08/22 0222)  ? ? ?ED Course/ Medical Decision Making/ A&P ?  ?                        ?Medical Decision Making ?Amount and/or Complexity of Data Reviewed ?Labs: ordered. ?Radiology: ordered. ? ?Risk ?Prescription drug management. ? ? ?Patient here for evaluation of right upper quadrant pain.  She has mild tenderness on examination without peritoneal findings.  UA is concerning for UTI.  Patient does not have current urinary symptoms.  Given her pain location and abnormal urinalysis CT scan was obtained.  CT is negative for appendicitis, obstructing stone.  Current clinical picture is not  consistent with cholecystitis.  We will treat as UTI in setting of her pain and abnormal urinalysis.  We will send cultures.  Patient's creatinine is borderline elevated.  Plan to discharge home with PCP follow-up as well as return precautions. ? ? ? ? ? ? ? ?Final Clinical Impression(s) / ED Diagnoses ?Final diagnoses:  ?Acute UTI  ?RUQ pain  ? ? ?Rx / DC Orders ?ED Discharge Orders   ? ?      Ordered  ?  cephALEXin (KEFLEX) 500 MG capsule  4 times daily       ? 02/08/22 0303  ? ?  ?  ? ?  ? ? ?  ?Quintella Reichert, MD ?02/08/22 [id number] ? ?

## 2022-02-10 LAB — URINE CULTURE: Culture: >=100000 — AB

## 2022-02-11 ENCOUNTER — Telehealth: Payer: Self-pay | Admitting: Emergency Medicine

## 2022-02-11 NOTE — Telephone Encounter (Signed)
Post ED Visit - Positive Culture Follow-up ? ?Culture report reviewed by antimicrobial stewardship pharmacist: ?[facility] ?[]  , Pharm.D. ?[]  Enzo Bi, Pharm.D., BCPS AQ-ID ?[]  , Pharm.D., BCPS ?[x]  Celedonio Miyamoto, Pharm.D., BCPS ?[]  Perkins, Garvin Fila.D., BCPS, AAHIVP ?[]  , Pharm.D., BCPS, AAHIVP ?[]  Georgina Pillion, PharmD, BCPS ?[]  , PharmD, BCPS ?[]  [place], PharmD, BCPS ?[]  [address], PharmD ?[]  , PharmD, BCPS ?[]  Estella Husk, PharmD ? ? [facility] ?[]  Lysle Pearl, PharmD ?[]  , PharmD ?[]  Phillips Climes, PharmD ?[]  , Rph ?[]  Agapito Games) , PharmD ?[]  Verlan Friends, PharmD ?[]  , PharmD ?[]  Mervyn Gay, PharmD ?[]  , PharmD ?[]  Vinnie Level, PharmD ?[]  Gerri Spore, PharmD ?[]  , PharmD ?[]  Len Childs, PharmD ? ? ?Positive urine culture ?Treated with cephalexin, organism sensitive to the same and no further patient follow-up is required at this time. ? ? ?02/11/2022, 9:55 AM ?  ?

## 2022-02-14 ENCOUNTER — Ambulatory Visit (INDEPENDENT_AMBULATORY_CARE_PROVIDER_SITE_OTHER): Payer: Medicaid Other | Admitting: Nurse Practitioner

## 2022-02-14 ENCOUNTER — Encounter: Payer: Self-pay | Admitting: Nurse Practitioner

## 2022-02-14 VITALS — BP 132/67 | HR 89 | Temp 97.8°F | Ht 62.99 in | Wt 281.4 lb

## 2022-02-14 DIAGNOSIS — R10811 Right upper quadrant abdominal tenderness: Secondary | ICD-10-CM

## 2022-02-14 DIAGNOSIS — Z6841 Body Mass Index (BMI) 40.0 and over, adult: Secondary | ICD-10-CM | POA: Diagnosis not present

## 2022-02-14 DIAGNOSIS — N390 Urinary tract infection, site not specified: Secondary | ICD-10-CM

## 2022-02-14 NOTE — Progress Notes (Signed)
Established patient visit   Patient: Linard MillersKiley J Tailor   DOB: 2001-04-23   21 y.o. Female  MRN: [id number] Visit Date: 02/14/2022   Chief Complaint  Patient presents with   Abdominal Pain    RUQ   Subjective    HPI HPI     Abdominal Pain    Additional comments: RUQ      Last edited by Carlean JewsBoscia, Bonni Neuser E, NP on 02/14/2022  3:07 PM.      Patient was seen in the ER on 02/08/2022 due to RUQ pain and UTI.  Urine sample was positive for a UTI.  A sample was sent for culture and sensitivity.  CT scan of her abdomen pelvis was obtained while she was in the ER.  There is no local lymph node abnormality.  No gallstones, gallbladder wall thickening, or biliary dilation.  The pancreas is unremarkable.  The spleen was normal in size without focal abnormality.  Adrenal glands are within normal limits.  Kidneys demonstrate no renal calculi or obstructive changes.  Bladder is well distended.  No obstructive or inflammatory changes of the colon were seen.  The appendix is within normal limits.  No inflammatory changes are seen.  There were no significant vascular findings.  No enlarged abdominal or pelvic lymph nodes are present.  The uterus and bilateral adnexa were unremarkable.  No abdominal wall hernia or abnormality.  There is no evidence abdominopelvic ascites.  It was a normal-appearing appendix with no acute abnormality noted.  The patient was discharged from the hospital with a prescription for Keflex which is 1 capsule 4 times daily until she runs out of medication.  She has not quite finished these antibiotics. She is feeling mostly better.   Medications: Outpatient Medications Prior to Visit  Medication Sig   albuterol (VENTOLIN HFA) 108 (90 Base) MCG/ACT inhaler Inhale 2 puffs into the lungs every 6 (six) hours as needed.     cephALEXin (KEFLEX) 500 MG capsule Take 1 capsule (500 mg total) by mouth 4 (four) times daily.   No facility-administered medications prior to visit.    Review of  Systems  Constitutional:  Positive for fatigue. Negative for activity change, appetite change, chills and fever.  HENT:  Negative for congestion, postnasal drip, rhinorrhea, sinus pressure, sinus pain, sneezing and sore throat.   Eyes: Negative.   Respiratory:  Negative for cough, chest tightness, shortness of breath and wheezing.   Cardiovascular:  Negative for chest pain and palpitations.  Gastrointestinal:  Negative for abdominal distention, abdominal pain, constipation, diarrhea, nausea and vomiting.  Endocrine: Negative for cold intolerance, heat intolerance, polydipsia and polyuria.  Genitourinary:  Negative for dyspareunia, dysuria, flank pain, frequency and urgency.  Musculoskeletal:  Negative for arthralgias, back pain and myalgias.  Skin:  Negative for rash.  Allergic/Immunologic: Negative for environmental allergies.  Neurological:  Negative for dizziness, weakness and headaches.  Hematological:  Negative for adenopathy.  Psychiatric/Behavioral:  The patient is not nervous/anxious.    Last CBC Lab Results  Component Value Date   WBC 8.5 02/08/2022   HGB 14.3 02/08/2022   HCT 41.8 02/08/2022   MCV 89.7 02/08/2022   MCH 30.7 02/08/2022   RDW 12.7 02/08/2022   PLT 329 02/08/2022   Last metabolic panel Lab Results  Component Value Date   GLUCOSE 96 02/08/2022   NA 140 02/08/2022   K 4.1 02/08/2022   CL 108 02/08/2022   CO2 25 02/08/2022   BUN 15 02/08/2022   CREATININE 1.01 (H) 02/08/2022  GFRNONAA >60 02/08/2022   CALCIUM 9.0 02/08/2022   PROT 7.1 02/08/2022   ALBUMIN 4.0 02/08/2022   BILITOT 0.6 02/08/2022   ALKPHOS 76 02/08/2022   AST 22 02/08/2022   ALT 21 02/08/2022   ANIONGAP 7 02/08/2022   Urinalysis    Component Value Date/Time   COLORURINE YELLOW 02/08/2022 0034   APPEARANCEUR HAZY (A) 02/08/2022 0034   LABSPEC 1.019 02/08/2022 0034   PHURINE 7.0 02/08/2022 0034   GLUCOSEU NEGATIVE 02/08/2022 0034   HGBUR NEGATIVE 02/08/2022 0034   BILIRUBINUR  NEGATIVE 02/08/2022 0034   KETONESUR NEGATIVE 02/08/2022 0034   PROTEINUR NEGATIVE 02/08/2022 0034   UROBILINOGEN 0.2 02/28/2015 1544   NITRITE POSITIVE (A) 02/08/2022 0034   LEUKOCYTESUR TRACE (A) 02/08/2022 0034         Objective     Today's Vitals   02/14/22 1501  BP: 132/67  Pulse: 89  Temp: 97.8 F (36.6 C)  SpO2: 97%  Weight: 281 lb 6.4 oz (127.6 kg)  Height: 5' 2.99" (1.6 m)   Body mass index is 49.86 kg/m.   BP Readings from Last 3 Encounters:  02/14/22 132/67  02/08/22 (!) 142/75  12/17/21 107/66    Wt Readings from Last 3 Encounters:  02/14/22 281 lb 6.4 oz (127.6 kg)  02/08/22 280 lb (127 kg)  12/17/21 282 lb 8 oz (128.1 kg) (>99 %, Z= 2.76)*   * Growth percentiles are based on CDC (Girls, 2-20 Years) data.    Physical Exam Vitals and nursing note reviewed.  Constitutional:      Appearance: Normal appearance. She is well-developed.  HENT:     Head: Normocephalic and atraumatic.     Nose: Nose normal.     Mouth/Throat:     Mouth: Mucous membranes are moist.     Pharynx: Oropharynx is clear.  Eyes:     Extraocular Movements: Extraocular movements intact.     Conjunctiva/sclera: Conjunctivae normal.     Pupils: Pupils are equal, round, and reactive to light.  Cardiovascular:     Rate and Rhythm: Normal rate and regular rhythm.     Pulses: Normal pulses.     Heart sounds: Normal heart sounds.  Pulmonary:     Effort: Pulmonary effort is normal.     Breath sounds: Normal breath sounds.  Abdominal:     General: Abdomen is flat. Bowel sounds are normal.     Palpations: Abdomen is soft.     Tenderness: There is abdominal tenderness in the right upper quadrant. There is right CVA tenderness. There is no left CVA tenderness, guarding or rebound. Negative signs include Murphy's sign, Rovsing's sign, McBurney's sign, psoas sign and obturator sign.     Hernia: No hernia is present.  Musculoskeletal:        General: Normal range of motion.     Cervical  back: Normal range of motion and neck supple.  Lymphadenopathy:     Cervical: No cervical adenopathy.  Skin:    General: Skin is warm and dry.     Capillary Refill: Capillary refill takes less than 2 seconds.  Neurological:     General: No focal deficit present.     Mental Status: She is alert and oriented to person, place, and time.  Psychiatric:        Mood and Affect: Mood normal.        Behavior: Behavior normal.        Thought Content: Thought content normal.        Judgment: Judgment  normal.      Assessment & Plan    1. Urinary tract infection without hematuria, site unspecified Reviewed patient's progress notes, labs, and imaging from the ER.  She did have nitrite positive UTI.  Has been treated with Keflex of which she is still on.  She is feeling better.  She should continue to take Keflex until prescription is entirely gone.  2. Right upper quadrant abdominal tenderness without rebound tenderness Right upper quadrant tenderness likely from urinary tract infection is improving with antibiotic treatment.  3. Body mass index (BMI) of 45.0-49.9 in adult [facility]) Discussed lowering calorie intake to 1500 calories per day and incorporating exercise into daily routine to help lose weight.    Problem List Items Addressed This Visit       Genitourinary   Urinary tract infection without hematuria - Primary     Other   Right upper quadrant abdominal tenderness without rebound tenderness   Body mass index (BMI) of 45.0-49.9 in adult [facility])     Return for prn worsening or persistent symptoms.         Carlean Jews, NP  [facility] at [facility] [phone] (phone) [phone] (fax)  [facility]

## 2022-02-25 DIAGNOSIS — N390 Urinary tract infection, site not specified: Secondary | ICD-10-CM

## 2022-02-25 HISTORY — DX: Urinary tract infection, site not specified: N39.0

## 2022-03-27 ENCOUNTER — Telehealth: Payer: Medicaid Other | Admitting: Physician Assistant

## 2022-03-27 DIAGNOSIS — L03211 Cellulitis of face: Secondary | ICD-10-CM

## 2022-03-27 DIAGNOSIS — L03213 Periorbital cellulitis: Secondary | ICD-10-CM

## 2022-03-27 NOTE — Progress Notes (Signed)
Based on what you shared with me, I feel your condition warrants further evaluation as soon as possible at an Emergency department. I am very concerned for an periorbital cellulitis -- If this progresses, it can affect the posterior orbit and eye and affect vision so you need an evaluation ASAP to get proper diagnosis and to be started on treatment, possibly IV antibiotics.    NOTE: There will be NO CHARGE for this eVisit   If you are having a true medical emergency please call 911.      Emergency Department-[facility]  Get Driving Directions  [phone]  [address]  [place], [place] [address]  Open 24/7/365      [facility] Emergency Department at [facility]  Get Driving Directions  [address]  [place], [place] [address]  Open 24/7/365    Emergency Department- [facility]  Get Driving Directions  [phone]  [address]  [place], [place] [address]  Open 24/7/365      [facility] Emergency Department at [facility]  Get Driving Directions  [phone]  [address]  [place], [place] [address]  Open 24/7/365    [facility]  Emergency Department- [facility]  Get Driving Directions  [phone]  [address]  [place], [place] [address]  Open 24/7/365    [place]  Emergency Department- [facility]  Get Driving Directions  [address]  [place], [place] [address]  Open 24/7/365    [facility]  Emergency Department- [facility]  Get Driving Directions  [phone]  [address]  [place], [place] [address]  Open 24/7/365

## 2022-05-30 ENCOUNTER — Telehealth: Payer: Medicaid Other | Admitting: Physician Assistant

## 2022-05-30 DIAGNOSIS — R42 Dizziness and giddiness: Secondary | ICD-10-CM | POA: Diagnosis not present

## 2022-05-30 NOTE — Patient Instructions (Signed)
  Elizabeth [name], thank you for joining Piedad Climes, PA-C for today's virtual visit.  While this provider is not your primary care provider (PCP), if your PCP is located in our provider database this encounter information will be shared with them immediately following your visit.  Consent: (Patient) Elizabeth [name] provided verbal consent for this virtual visit at the beginning of the encounter.  Current Medications:  Current Outpatient Medications:    albuterol (VENTOLIN HFA) 108 (90 Base) MCG/ACT inhaler, Inhale 2 puffs into the lungs every 6 (six) hours as needed.  , Disp: , Rfl:    cephALEXin (KEFLEX) 500 MG capsule, Take 1 capsule (500 mg total) by mouth 4 (four) times daily., Disp: 28 capsule, Rfl: 0   Medications ordered in this encounter:  No orders of the defined types were placed in this encounter.    *If you need refills on other medications prior to your next appointment, please contact your pharmacy*  Follow-Up: Call back or seek an in-person evaluation if the symptoms worsen or if the condition fails to improve as anticipated.  Other Instructions Please keep well-hydrated and try to get plenty of rest. I want you to elevate your legs while resting. Start an electrolyte water like G2 Gatorade, propel or Pedialyte. Do not skip meals.  Try to keep a well-balanced diet. Stay out of excess heat. Tomorrow morning I want you to contact her PCP for evaluation.  If they cannot see you before the end of this week, I want you to be evaluated in person of the local urgent care so you can get examination and blood work. If there is any recurrence of symptoms, especially more significant episode, I want you to seek ER evaluation.  Please do not delay care.   If you have been instructed to have an in-person evaluation today at a local Urgent Care facility, please use the link below. It will take you to a list of all of our available [facility], including address, phone  number and hours of operation. Please do not delay care.  [facility]  If you or a family member do not have a primary care provider, use the link below to schedule a visit and establish care. When you choose a [facility] primary care physician or advanced practice provider, you gain a long-term partner in health. Find a Primary Care Provider  Learn more about [facility]'s in-office and virtual care options: [facility] - Get Care Now

## 2022-05-30 NOTE — Progress Notes (Signed)
Virtual Visit Consent   Elizabeth [name], you are scheduled for a virtual visit with a [facility] provider today. Just as with appointments in the office, your consent must be obtained to participate. Your consent will be active for this visit and any virtual visit you may have with one of our providers in the next 365 days. If you have a MyChart account, a copy of this consent can be sent to you electronically.  As this is a virtual visit, video technology does not allow for your provider to perform a traditional examination. This may limit your provider's ability to fully assess your condition. If your provider identifies any concerns that need to be evaluated in person or the need to arrange testing (such as labs, EKG, etc.), we will make arrangements to do so. Although advances in technology are sophisticated, we cannot ensure that it will always work on either your end or our end. If the connection with a video visit is poor, the visit may have to be switched to a telephone visit. With either a video or telephone visit, we are not always able to ensure that we have a secure connection.  By engaging in this virtual visit, you consent to the provision of healthcare and authorize for your insurance to be billed (if applicable) for the services provided during this visit. Depending on your insurance coverage, you may receive a charge related to this service.  I need to obtain your verbal consent now. Are you willing to proceed with your visit today? Elizabeth [name] has provided verbal consent on 05/30/2022 for a virtual visit (video or telephone). Elizabeth [name], [place]  Date: 05/30/2022 5:26 PM  Virtual Visit via Video Note   I, Elizabeth [name], connected with  Elizabeth [name]  ([id number], December 04, 2000) on 05/30/22 at  5:00 PM EDT by a video-enabled telemedicine application and verified that I am speaking with the correct person using two identifiers.  Location: Patient: Virtual Visit Location  Patient: Home Provider: Virtual Visit Location Provider: Home Office   I discussed the limitations of evaluation and management by telemedicine and the availability of in person appointments. The patient expressed understanding and agreed to proceed.    History of Present Illness: Elizabeth [name] is a 21 y.o. who identifies as a female who was assigned female at birth, and is being seen today for 2 weeks of episodes of dizziness, described as sensation of unsteadiness and lightheadedness, occurring randomly throughout the day but seems to be worse at night.  Was also worse recently on her menstrual period. Some associated nausea w/o vomiting.  Denies any altered mental status, vision change or frequent headache.  Has checked her blood pressure during these episodes and sometimes noting it to be elevated with highest BP at 140/108 and lowest BP being 77/30 but came back up very quickly.  Denies any chest pain, shortness of breath or palpitations.  Heart rate averages 88 to 90 bpm.  States these episodes last about 5 minutes before resolving.  Denies any change in diet.  Is trying to keep well-hydrated.  Denies any excess heat exposure.  Denies any substantial change to stressors or activity level.  Denies change to sleep.     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  Observations/Objective: Patient is well-developed, well-nourished in no acute distress.  Resting comfortably at home.  Head is normocephalic, atraumatic.  No labored breathing. Speech is clear and coherent with logical content.  Patient is alert and oriented at baseline.   Assessment and Plan: 1. Episodic lightheadedness  Asymptomatic at present.  Episodic and going on for 2 weeks.  Worse under times of physical or emotional stress.  Labile blood pressure during these episodes.  Concern for something causing frequent vagal episodes.  Since she is asymptomatic presently, we will try to avoid ER but strict ER precautions reviewed.  We will have her hydrate and start electrolyte water.  Elevate legs and rest this evening.  Avoid excess heat exposure and avoid skipping meals.  She is to contact her PCP first thing in the morning for further evaluation.  If they are unable to see her in a timely fashion, recommended urgent care evaluation so she can get examination, manual vital sign check, EKG and blood work.  Follow Up Instructions: I discussed the assessment and treatment plan with the patient. The patient was provided an opportunity to ask questions and all were answered. The patient agreed with the plan and demonstrated an understanding of the instructions.  A copy of instructions were sent to the patient via MyChart unless otherwise noted below.   The patient was advised to call back or seek an in-person evaluation if the symptoms worsen or if the condition fails to improve as anticipated.  Time:  I spent 10 minutes with the patient via telehealth technology discussing the above problems/concerns.    Elizabeth Climes, PA-C

## 2022-07-22 ENCOUNTER — Inpatient Hospital Stay (HOSPITAL_COMMUNITY)
Admission: AD | Admit: 2022-07-22 | Discharge: 2022-07-22 | Disposition: A | Discharge status: Home / Self Care | Payer: Medicaid Other | Attending: Obstetrics & Gynecology | Admitting: Obstetrics & Gynecology

## 2022-07-22 DIAGNOSIS — Z3202 Encounter for pregnancy test, result negative: Secondary | ICD-10-CM | POA: Diagnosis present

## 2022-07-22 DIAGNOSIS — R109 Unspecified abdominal pain: Secondary | ICD-10-CM | POA: Insufficient documentation

## 2022-07-22 LAB — HCG, QUANTITATIVE, PREGNANCY: hCG, Beta Chain, Quant, S: <1 m[IU]/mL (ref <5)

## 2022-07-22 LAB — POCT PREGNANCY, URINE: Preg Test, Ur: NEGATIVE

## 2022-07-22 NOTE — Discharge Instructions (Signed)
[facility] Providers   [facility]             [address], [place], [place] [address] [phone]  [facility]                                                             [address], [address], [place], [place], [address] [phone]  [facility]                                    [address], [address], [place], [place], [address] [phone]  [facility] [place] [address], [place], [place], [address] [phone]  [facility]                                 [address], [place], [place], [address] [phone]  [facility]                                    [address], [place], [place], [address] [phone]  [facility] [address], [address], [place], [place], [address]                              [facility] [address], [place], , [address] [phone]  [facility]         Phone: [phone]  [facility]      Phone: [phone]   [facility]      Phone: [phone]  [facility]         Phone: [phone]   [facility]    Phone: [phone]  [facility]      Phone: [phone]  [facility]      Phone: [phone]  [facility]        Phone: [phone]  [facility] (Sheronette Cousins) [phone]  [facility]      Phone: [phone]   

## 2022-07-22 NOTE — MAU Note (Addendum)
...  Elizabeth [name] is a 21 y.o. at Unknown here in MAU reporting: Mid left abdominal pain that is "constantly sore" with intermittent worsening pain since yesterday. She reports she started bleeding yesterday and reports it is light and has also been experiencing lower abdominal cramps that feel like period cramps.   She reports a "faint" positive pregnancy test this past Saturday and reports she had a negative pregnancy test this past Sunday. Patient's significant other reports he did not see the faint line on Saturday.  Negative UPT in MAU.  LMP: 06/20/2022 - currently bleeding now as well.  Onset of complaint: Yesterday  Pain score:  5/10 left sided mid abdominal pain 5/10 lower abdomen  Lab orders placed from triage: UPT

## 2022-07-22 NOTE — MAU Provider Note (Signed)
MAU note   S Ms. Elizabeth [name] is a 21 y.o. G0P0000 patient who presents to MAU today with complaint of flank pain. Had a possible positive pregnancy test at home.   O BP (!) 147/88 (BP Location: Right Arm)   Pulse 86   Temp 98.2 F (36.8 C) (Oral)   Resp 15   Ht 5\' 3"  (1.6 m)   Wt 126.5 kg   SpO2 99%   BMI 49.39 kg/m  Physical Exam Vitals and nursing note reviewed.  Pulmonary:     Effort: Pulmonary effort is normal.  Abdominal:     General: Abdomen is flat.     Palpations: Abdomen is soft.  Skin:    Capillary Refill: Capillary refill takes less than 2 seconds.  Psychiatric:        Mood and Affect: Mood normal.        Behavior: Behavior normal.        Thought Content: Thought content normal.        Judgment: Judgment normal.    Results for orders placed or performed during the hospital encounter of 07/22/22 (from the past 24 hour(s))  Pregnancy, urine POC     Status: None   Collection Time: 07/22/22  3:40 PM  Result Value Ref Range   Preg Test, Ur NEGATIVE NEGATIVE  hCG, quantitative, pregnancy     Status: None   Collection Time: 07/22/22  4:03 PM  Result Value Ref Range   hCG, Beta Chain, Quant, S <1 <5 mIU/mL     A Medical screening exam  1. Flank pain     P Discharge from MAU in stable condition Patient given the option of transfer to [facility] for further evaluation or seek care in outpatient facility of choice. Patient declined. List of options for follow-up given  Warning signs for worsening condition that would warrant emergency follow-up discussed Patient may return to MAU as needed   Truett Mainland, DO 07/22/2022 5:02 PM

## 2022-11-14 ENCOUNTER — Telehealth: Payer: Medicaid Other | Admitting: Nurse Practitioner

## 2022-11-14 DIAGNOSIS — J069 Acute upper respiratory infection, unspecified: Secondary | ICD-10-CM

## 2022-11-14 MED ORDER — BENZONATATE 100 MG PO CAPS: 100.0000 mg | ORAL_CAPSULE | Freq: Three times a day (TID) | ORAL | 0 refills | Status: DC | PRN

## 2022-11-14 MED ORDER — FLUTICASONE PROPIONATE 50 MCG/ACT NA SUSP: 2.0000 | Freq: Every day | NASAL | 6 refills | Status: DC

## 2022-11-14 NOTE — Progress Notes (Signed)
E-Visit for Upper Respiratory Infection   We are sorry you are not feeling well.  Here is how we plan to help!  Based on what you have shared with me, it looks like you may have a viral upper respiratory infection.  Upper respiratory infections are caused by a large number of viruses; however, rhinovirus is the most common cause. Your symptoms may have also been from the flu, but the window to start Tamiflu is 72 hours and you are outside of that window now.   It is too early in your symptom history of antibiotics. Providers prescribe antibiotics to treat infections caused by bacteria. Antibiotics are very powerful in treating bacterial infections when they are used properly. To maintain their effectiveness, they should be used only when necessary. Overuse of antibiotics has resulted in the development of superbugs that are resistant to treatment!    After careful review of your answers, I would not recommend an antibiotic for your condition.  Antibiotics are not effective against viruses and therefore should not be used to treat them. Common examples of infections caused by viruses include colds and flu   Symptoms vary from person to person, with common symptoms including sore throat, cough, fatigue or lack of energy and feeling of general discomfort.  A low-grade fever of up to 100.4 may present, but is often uncommon.  Symptoms vary however, and are closely related to a person's age or underlying illnesses.  The most common symptoms associated with an upper respiratory infection are nasal discharge or congestion, cough, sneezing, headache and pressure in the ears and face.  These symptoms usually persist for about 3 to 10 days, but can last up to 2 weeks.  It is important to know that upper respiratory infections do not cause serious illness or complications in most cases.    Upper respiratory infections can be transmitted from person to person, with the most common method of transmission being a  person's hands.  The virus is able to live on the skin and can infect other persons for up to 2 hours after direct contact.  Also, these can be transmitted when someone coughs or sneezes; thus, it is important to cover the mouth to reduce this risk.  To keep the spread of the illness at [place], good hand hygiene is very important.  This is an infection that is most likely caused by a virus. There are no specific treatments other than to help you with the symptoms until the infection runs its course.  We are sorry you are not feeling well.  Here is how we plan to help!   For nasal congestion, you may use an oral decongestants such as Mucinex D or if you have glaucoma or high blood pressure use plain Mucinex.  Saline nasal spray or nasal drops can help and can safely be used as often as needed for congestion.  For your congestion, I have prescribed Fluticasone nasal spray one spray in each nostril twice a day  If you do not have a history of heart disease, hypertension, diabetes or thyroid disease, prostate/bladder issues or glaucoma, you may also use Sudafed to treat nasal congestion.  It is highly recommended that you consult with a pharmacist or your primary care physician to ensure this medication is safe for you to take.     If you have a cough, you may use cough suppressants such as Delsym and Robitussin.  If you have glaucoma or high blood pressure, you can also use Coricidin  HBP.   For cough I have prescribed for you A prescription cough medication called Tessalon Perles 100 mg. You may take 1-2 capsules every 8 hours as needed for cough  If you have a sore or scratchy throat, use a saltwater gargle-  to  teaspoon of salt dissolved in a 4-ounce to 8-ounce glass of warm water.  Gargle the solution for approximately 15-30 seconds and then spit.  It is important not to swallow the solution.  You can also use throat lozenges/cough drops and Chloraseptic spray to help with throat pain or discomfort.   Warm or cold liquids can also be helpful in relieving throat pain.  For headache, pain or general discomfort, you can use Ibuprofen or Tylenol as directed.   Some authorities believe that zinc sprays or the use of Echinacea may shorten the course of your symptoms.   HOME CARE Only take medications as instructed by your medical team. Be sure to drink plenty of fluids. Water is fine as well as fruit juices, sodas and electrolyte beverages. You may want to stay away from caffeine or alcohol. If you are nauseated, try taking small sips of liquids. How do you know if you are getting enough fluid? Your urine should be a pale yellow or almost colorless. Get rest. Taking a steamy shower or using a humidifier may help nasal congestion and ease sore throat pain. You can place a towel over your head and breathe in the steam from hot water coming from a faucet. Using a saline nasal spray works much the same way. Cough drops, hard candies and sore throat lozenges may ease your cough. Avoid close contacts especially the very young and the elderly Cover your mouth if you cough or sneeze Always remember to wash your hands.   GET HELP RIGHT AWAY IF: You develop worsening fever. If your symptoms do not improve within 10 days You develop yellow or green discharge from your nose over 3 days. You have coughing fits You develop a severe head ache or visual changes. You develop shortness of breath, difficulty breathing or start having chest pain Your symptoms persist after you have completed your treatment plan  MAKE SURE YOU  Understand these instructions. Will watch your condition. Will get help right away if you are not doing well or get worse.  Thank you for choosing an e-visit.  Your e-visit answers were reviewed by a board certified advanced clinical practitioner to complete your personal care plan. Depending upon the condition, your plan could have included both over the counter or prescription  medications.  Please review your pharmacy choice. Make sure the pharmacy is open so you can pick up prescription now. If there is a problem, you may contact your provider through [employer] and have the prescription routed to another pharmacy.  Your safety is important to [place]. If you have drug allergies check your prescription carefully.   For the next 24 hours you can use MyChart to ask questions about today's visit, request a non-urgent call back, or ask for a work or school excuse. You will get an email in the next two days asking about your experience. I hope that your e-visit has been valuable and will speed your recovery.  Meds ordered this encounter  Medications   benzonatate (TESSALON) 100 MG capsule    Sig: Take 1 capsule (100 mg total) by mouth 3 (three) times daily as needed.    Dispense:  30 capsule    Refill:  0  fluticasone (FLONASE) 50 MCG/ACT nasal spray    Sig: Place 2 sprays into both nostrils daily.    Dispense:  16 g    Refill:  6     I spent approximately 5 minutes reviewing the patient's history, current symptoms and coordinating their care today.

## 2022-12-18 ENCOUNTER — Emergency Department
Admission: EM | Admit: 2022-12-18 | Discharge: 2022-12-18 | Disposition: A | Discharge status: Home / Self Care | Payer: Medicaid Other | Attending: Emergency Medicine | Admitting: Emergency Medicine

## 2022-12-18 ENCOUNTER — Other Ambulatory Visit: Payer: Self-pay

## 2022-12-18 ENCOUNTER — Telehealth: Payer: Self-pay | Admitting: *Deleted

## 2022-12-18 ENCOUNTER — Emergency Department: Payer: Medicaid Other

## 2022-12-18 ENCOUNTER — Encounter: Payer: Self-pay | Admitting: Emergency Medicine

## 2022-12-18 DIAGNOSIS — R1030 Lower abdominal pain, unspecified: Secondary | ICD-10-CM

## 2022-12-18 DIAGNOSIS — R1032 Left lower quadrant pain: Secondary | ICD-10-CM | POA: Diagnosis not present

## 2022-12-18 DIAGNOSIS — J45909 Unspecified asthma, uncomplicated: Secondary | ICD-10-CM | POA: Insufficient documentation

## 2022-12-18 DIAGNOSIS — R051 Acute cough: Secondary | ICD-10-CM

## 2022-12-18 DIAGNOSIS — R110 Nausea: Secondary | ICD-10-CM | POA: Diagnosis not present

## 2022-12-18 DIAGNOSIS — N926 Irregular menstruation, unspecified: Secondary | ICD-10-CM

## 2022-12-18 HISTORY — DX: Irritable bowel syndrome, unspecified: K58.9

## 2022-12-18 LAB — COMPREHENSIVE METABOLIC PANEL
ALT: 25 U/L (ref 0–44)
AST: 22 U/L (ref 15–41)
Albumin: 4.1 g/dL (ref 3.5–5.0)
Alkaline Phosphatase: 72 U/L (ref 38–126)
Anion gap: 8 (ref 5–15)
BUN: 15 mg/dL (ref 6–20)
CO2: 20 mmol/L — ABNORMAL LOW (ref 22–32)
Calcium: 9.1 mg/dL (ref 8.9–10.3)
Chloride: 109 mmol/L (ref 98–111)
Creatinine, Ser: 0.9 mg/dL (ref 0.44–1.00)
GFR, Estimated: >60 mL/min (ref >60)
Glucose, Bld: 100 mg/dL — ABNORMAL HIGH (ref 70–99)
Potassium: 3.9 mmol/L (ref 3.5–5.1)
Sodium: 137 mmol/L (ref 135–145)
Total Bilirubin: 0.5 mg/dL (ref 0.3–1.2)
Total Protein: 7.5 g/dL (ref 6.5–8.1)

## 2022-12-18 LAB — URINALYSIS, ROUTINE W REFLEX MICROSCOPIC
Bilirubin Urine: NEGATIVE
Glucose, UA: NEGATIVE mg/dL
Ketones, ur: NEGATIVE mg/dL
Nitrite: NEGATIVE
Protein, ur: NEGATIVE mg/dL
Specific Gravity, Urine: 1.023 (ref 1.005–1.030)
pH: 6 (ref 5.0–8.0)

## 2022-12-18 LAB — CBC WITH DIFFERENTIAL/PLATELET
Abs Immature Granulocytes: 0.01 10*3/uL (ref 0.00–0.07)
Basophils Absolute: 0.1 10*3/uL (ref 0.0–0.1)
Basophils Relative: 1 %
Eosinophils Absolute: 0.1 10*3/uL (ref 0.0–0.5)
Eosinophils Relative: 1 %
HCT: 45.1 % (ref 36.0–46.0)
Hemoglobin: 14.7 g/dL (ref 12.0–15.0)
Immature Granulocytes: 0 %
Lymphocytes Relative: 39 %
Lymphs Abs: 4.1 10*3/uL — ABNORMAL HIGH (ref 0.7–4.0)
MCH: 29.1 pg (ref 26.0–34.0)
MCHC: 32.6 g/dL (ref 30.0–36.0)
MCV: 89.3 fL (ref 80.0–100.0)
Monocytes Absolute: 0.7 10*3/uL (ref 0.1–1.0)
Monocytes Relative: 7 %
Neutro Abs: 5.4 10*3/uL (ref 1.7–7.7)
Neutrophils Relative %: 52 %
Platelets: 359 10*3/uL (ref 150–400)
RBC: 5.05 MIL/uL (ref 3.87–5.11)
RDW: 12.3 % (ref 11.5–15.5)
WBC: 10.4 10*3/uL (ref 4.0–10.5)
nRBC: 0 % (ref 0.0–0.2)

## 2022-12-18 LAB — POC URINE PREG, ED: Preg Test, Ur: NEGATIVE

## 2022-12-18 LAB — LIPASE, BLOOD: Lipase: 36 U/L (ref 11–51)

## 2022-12-18 MED ORDER — AZITHROMYCIN 250 MG PO TABS: ORAL_TABLET | ORAL | 0 refills | Status: AC

## 2022-12-18 MED ORDER — ONDANSETRON 4 MG PO TBDP: 4.0000 mg | ORAL_TABLET | Freq: Three times a day (TID) | ORAL | 0 refills | Status: DC | PRN

## 2022-12-18 MED ORDER — IOHEXOL 300 MG/ML  SOLN
100.0000 mL | Freq: Once | INTRAMUSCULAR | Status: AC | PRN
  Administered 2022-12-18: 100 mL via INTRAVENOUS

## 2022-12-18 MED ORDER — ONDANSETRON HCL 4 MG/2ML IJ SOLN
4.0000 mg | Freq: Once | INTRAMUSCULAR | Status: AC
  Administered 2022-12-18: 4 mg via INTRAVENOUS
  Filled 2022-12-18: qty 2

## 2022-12-18 NOTE — ED Provider Notes (Signed)
[facility] Provider Note    Event Date/Time   First MD Initiated Contact with Patient 12/18/22 [phone]     (approximate)   History   Abdominal Pain   HPI  Elizabeth [name] is a 22 y.o. female past medical history of asthma and IBS who presents with abdominal pain.  Symptoms started last night.  Pain is located in the left lower quadrant and has been rather constant.  It is actually improved since coming to the ED.  Associated with some nausea but no vomiting.  Denies diarrhea or constipation.  Prior to the 7 days ago patient was having some upper abdominal pain but this is resolved.  Denies urinary symptoms vaginal bleeding or abnormal vaginal discharge.  Patient took pregnancy test at home that was faintly positive.    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   Irregular periods/menstrual cycles 10/18/2021   Body mass index (BMI) of 45.0-49.9 in adult [facility]) 10/18/2021   Miscarriage at 8 to [redacted] weeks gestation 10/18/2021   Encounter to establish care 05/22/2021   Right upper quadrant abdominal tenderness without rebound tenderness 05/22/2021   Plantar wart 05/22/2021   Episode of moderate major depression ([place]) 05/22/2021     Physical Exam  Triage Vital Signs: ED Triage Vitals  Enc Vitals Group     BP 12/18/22 0556 138/88     Pulse Rate 12/18/22 0556 90     Resp 12/18/22 0556 18     Temp 12/18/22 0556 98.2 F (36.8 C)     Temp Source 12/18/22 0556 Oral     SpO2 12/18/22 0556 100 %     Weight 12/18/22 0549 280 lb (127 kg)     Height 12/18/22 0549 '5\' 3"'$  (1.6 m)     Head Circumference --      Peak Flow --      Pain Score 12/18/22 0549 3     Pain Loc --      Pain Edu? --      Excl. in [place]? --     Most recent vital  signs: Vitals:   12/18/22 0556  BP: 138/88  Pulse: 90  Resp: 18  Temp: 98.2 F (36.8 C)  SpO2: 100%     General: Awake, no distress.  CV:  Good peripheral perfusion.  Resp:  Normal effort.  Abd:  No distention.  Mild tenderness palpation left lower quadrant but no guarding no right lower quadrant tenderness or upper quadrant tenderness Neuro:             Awake, Alert, Oriented x 3  Other:     ED Results / Procedures / Treatments  Labs (all labs ordered are listed, but only abnormal results are displayed) Labs Reviewed  URINALYSIS, ROUTINE W REFLEX MICROSCOPIC - Abnormal; Notable for the following components:      Result Value   Color, Urine YELLOW (*)    APPearance CLOUDY (*)    Hgb urine dipstick SMALL (*)    Leukocytes,Ua SMALL (*)    Bacteria, UA FEW (*)    All other components within normal limits  CBC WITH DIFFERENTIAL/PLATELET - Abnormal; Notable for the following components:   Lymphs Abs 4.1 (*)    All other components within normal  limits  COMPREHENSIVE METABOLIC PANEL - Abnormal; Notable for the following components:   CO2 20 (*)    Glucose, Bld 100 (*)    All other components within normal limits  LIPASE, BLOOD  POC URINE PREG, ED     EKG     RADIOLOGY   PROCEDURES:  Critical Care performed: No  Procedures  The patient is on the cardiac monitor to evaluate for evidence of arrhythmia and/or significant heart rate changes.   MEDICATIONS ORDERED IN ED: Medications  ondansetron (ZOFRAN) injection 4 mg (has no administration in time range)     IMPRESSION / MDM / ASSESSMENT AND PLAN / ED COURSE  I reviewed the triage vital signs and the nursing notes.                              Patient's presentation is most consistent with acute complicated illness / injury requiring diagnostic workup.  Differential diagnosis includes, but is not limited to, diverticulitis, kidney stone, pyelonephritis, constipation, ovarian torsion  Patient is a  22 year old female presenting with left lower quadrant abdominal pain.  Symptoms been going on since yesterday.  Associate with some nausea but no vomiting.  She denies urinary symptoms.  Has been having some lower abdominal pain earlier in the week but this is resolved.  Patient is concerned she could be pregnant.  Patient's vital signs are reassuring overall she looks well.  She has mild tenderness to palpation left lower quadrant but abdomen is overall benign.  Pregnancy test here is negative.  UA is not consistent with infection.  CBC CMP are reassuring.  Will obtain CT of the abdomen pelvis to evaluate for diverticulitis or other acute intra-abdominal process.  Patient signed out to oncoming provider pending CT.       FINAL CLINICAL IMPRESSION(S) / ED DIAGNOSES   Final diagnoses:  LLQ abdominal pain     Rx / DC Orders   ED Discharge Orders     None        Note:  This document was prepared using Dragon voice recognition software and may include unintentional dictation errors.   Rada Hay, MD 12/18/22 [phone]

## 2022-12-18 NOTE — Telephone Encounter (Signed)
Pt calling and needs to have a referral placed to gyn.  She states that she will have to go to an office that accepts medicaid. Looks like she was referred previously and never went to a provider and when I called to check her established status it said they were closed.  Please place new referral and I will find a office that takes medicaid.

## 2022-12-18 NOTE — Discharge Instructions (Signed)

## 2022-12-18 NOTE — ED Provider Notes (Signed)
Patient received in signout from the MICU pending follow-up CT imaging.  CT imaging without acute findings in the abdomen.  Does show some possible inflammatory or infiltrative process lower lung bases.  She is not febrile not hypoxic no acute distress does report increased cough over the past few days.  Will cover with Z-Pak for possible commune acquired pneumonia.  Does appear stable and appropriate for outpatient follow-up.   Merlyn Lot, MD 12/18/22 [phone]

## 2022-12-18 NOTE — Telephone Encounter (Signed)
Referral was placed 

## 2022-12-18 NOTE — ED Triage Notes (Signed)
Patient ambulatory to triage with steady gait, without difficulty or distress noted; pt reports lower abd pain since yesterday accomp by nausea

## 2022-12-18 NOTE — Telephone Encounter (Signed)
Received call from after hours nurse stating that this pt calling 12/17/22 and also this morning saying that she is having abdominal pain and has had positive and negative pregnancy test.  States that the pain comes and goes, no bleeding and pain a few days ago was a 2/10.  Chart shows that patient is currently admitted will route to provider as an [place]. [place] Peifer Zimmerman Rumple, CMA

## 2022-12-18 NOTE — ED Notes (Signed)
POC urine pregnancy test negative.

## 2022-12-18 NOTE — Telephone Encounter (Signed)
Thank you :)

## 2022-12-24 ENCOUNTER — Encounter: Payer: Self-pay | Admitting: Nurse Practitioner

## 2022-12-24 ENCOUNTER — Ambulatory Visit (INDEPENDENT_AMBULATORY_CARE_PROVIDER_SITE_OTHER): Payer: Medicaid Other | Admitting: Nurse Practitioner

## 2022-12-24 VITALS — BP 111/75 | HR 91 | Ht 63.0 in | Wt 274.1 lb

## 2022-12-24 DIAGNOSIS — R1011 Right upper quadrant pain: Secondary | ICD-10-CM | POA: Diagnosis not present

## 2022-12-24 DIAGNOSIS — R10811 Right upper quadrant abdominal tenderness: Secondary | ICD-10-CM

## 2022-12-24 NOTE — Progress Notes (Signed)
Established patient visit   Patient: Elizabeth [name]   DOB: 09-27-2001   22 y.o. Female  MRN: [id number] Visit Date: 12/24/2022  Chief Complaint  Patient presents with   Abdominal Pain   Subjective    Patient was seen in ER on 12/18/2022 -urine pregnancy test negative  -CT of abdomen and pelvis showed possible infectious process in the lungs.  -did take z-pack.  -has completed this  Prior to ER visit, she had two positive pregnancy tests at home. -she started menstrual period on 12/20/2022. Just finished bleeding 12/23/2022.  Abdominal Pain This is a new problem. The onset quality is sudden. The problem occurs 2 to 4 times per day. The problem is unchanged. The pain is located in the RUQ. Pain scale: pain is consistently at a 2/10. when pain becomes bad and sharp, it can get as bad as 7/10. The quality of the pain is described as sharp (stabbing type pain). The pain radiates to the back. Associated symptoms include anorexia and nausea. Pertinent negatives include no anxiety, arthralgias, belching, constipation, diarrhea, dysuria, fever, flatus, frequency, headaches, hematochezia or hematuria. (States that when she has had a bowel movement, stools are tinted green ) Nothing relieves the symptoms. (Dietary habits: had been having a great deal of fast oods. stopped this and started eating home-cooked meals stopped caffeine intake nad now drinking water) Past treatments include antibiotics. The treatment provided no improvement relief. She has had the following procedures: CT scan. Significant past medical history includes irritable bowel syndrome. Developmental delay: was diagnosed with IBS when she was in [employer].Marland KitchenPMH comments: pain is made worse by movement, eating. it is also worse in the mornings      Medications: Outpatient Medications Prior to Visit  Medication Sig   benzonatate (TESSALON) 100 MG capsule Take 1 capsule (100 mg total) by mouth 3 (three) times daily as needed.    fluticasone (FLONASE) 50 MCG/ACT nasal spray Place 2 sprays into both nostrils daily.   ondansetron (ZOFRAN-ODT) 4 MG disintegrating tablet Take 1 tablet (4 mg total) by mouth every 8 (eight) hours as needed for nausea or vomiting.   No facility-administered medications prior to visit.    Review of Systems  Constitutional:  Negative for fever.  Gastrointestinal:  Positive for abdominal pain, anorexia and nausea. Negative for constipation, diarrhea, flatus and hematochezia.  Genitourinary:  Negative for dysuria, frequency and hematuria.  Musculoskeletal:  Negative for arthralgias.  Neurological:  Negative for headaches.  Psychiatric/Behavioral:  The patient is not nervous/anxious.      Objective     Today's Vitals   12/24/22 1507  BP: 111/75  Pulse: 91  SpO2: 97%  Weight: 274 lb 1.9 oz (124.3 kg)  Height: 5\' 3"  (1.6 m)   Body mass index is 48.56 kg/m.   Physical Exam Vitals and nursing note reviewed.  Constitutional:      Appearance: Normal appearance. She is well-developed. She is ill-appearing.  HENT:     Head: Normocephalic and atraumatic.     Nose: Nose normal.     Mouth/Throat:     Mouth: Mucous membranes are moist.     Pharynx: Oropharynx is clear.  Eyes:     Extraocular Movements: Extraocular movements intact.     Conjunctiva/sclera: Conjunctivae normal.     Pupils: Pupils are equal, round, and reactive to light.  Neck:     Vascular: No carotid bruit.  Cardiovascular:     Rate and Rhythm: Normal rate and regular rhythm.  Pulses: Normal pulses.     Heart sounds: Normal heart sounds.  Pulmonary:     Effort: Pulmonary effort is normal.     Breath sounds: Normal breath sounds.  Abdominal:     General: Bowel sounds are normal. There is no distension.     Palpations: Abdomen is soft. There is no mass.     Tenderness: There is generalized abdominal tenderness. There is no right CVA tenderness, left CVA tenderness, guarding or rebound.     Hernia: No hernia is  present.  Musculoskeletal:        General: Normal range of motion.     Cervical back: Normal range of motion and neck supple.  Lymphadenopathy:     Cervical: No cervical adenopathy.  Skin:    General: Skin is warm and dry.     Capillary Refill: Capillary refill takes less than 2 seconds.  Neurological:     General: No focal deficit present.     Mental Status: She is alert and oriented to person, place, and time.  Psychiatric:        Mood and Affect: Mood normal.        Behavior: Behavior normal.        Thought Content: Thought content normal.        Judgment: Judgment normal.       Assessment & Plan     Right upper quadrant abdominal pain -     US ABDOMEN LIMITED RUQ (LIVER/GB); Future  Right upper quadrant abdominal tenderness without rebound tenderness Assessment & Plan: Reviewed CT scan of abdomen and pelvis from ER. Results show: 1. No acute findings within the abdomen or pelvis. 2. Mild patchy areas of ground-glass attenuation noted within both lower lobes. Findings are nonspecific but may reflect underlying inflammatory or infectious process. 3. Thoracolumbar scoliosis. Will get RUQ ultrasound for further evaluation.       Return for prn worsening or persistent symptoms.        Carlean Jews, NP  [facility] [phone] (phone) [phone] (fax)  [facility]

## 2022-12-25 ENCOUNTER — Other Ambulatory Visit: Payer: Self-pay | Admitting: Nurse Practitioner

## 2022-12-25 ENCOUNTER — Ambulatory Visit
Admission: RE | Admit: 2022-12-25 | Discharge: 2022-12-25 | Disposition: A | Discharge status: Home / Self Care | Payer: Medicaid Other | Source: Ambulatory Visit | Attending: Nurse Practitioner | Admitting: Nurse Practitioner

## 2022-12-25 ENCOUNTER — Telehealth: Payer: Self-pay

## 2022-12-25 DIAGNOSIS — R1011 Right upper quadrant pain: Secondary | ICD-10-CM

## 2022-12-25 NOTE — Telephone Encounter (Signed)
Pt calling in about her results from [facility] complete today.

## 2022-12-25 NOTE — Telephone Encounter (Signed)
Please let her know that the ultrasound showed evidence of fatty liver but was normal otherwise. There was no evidence of gallstones or cholecystitis. I have placed a referral for her to go to [place] for further evaluation. Thanks so much.   -HB

## 2022-12-25 NOTE — Progress Notes (Signed)
Referred patient to [facility] for further evaluation of abdominal pain

## 2022-12-26 NOTE — Telephone Encounter (Signed)
Awesome! Thank you!

## 2023-02-01 NOTE — Assessment & Plan Note (Signed)
Reviewed CT scan of abdomen and pelvis from ER. Results show: 1. No acute findings within the abdomen or pelvis. 2. Mild patchy areas of ground-glass attenuation noted within both lower lobes. Findings are nonspecific but may reflect underlying inflammatory or infectious process. 3. Thoracolumbar scoliosis. Will get RUQ ultrasound for further evaluation.

## 2023-03-12 ENCOUNTER — Telehealth: Payer: Self-pay

## 2023-03-12 NOTE — Telephone Encounter (Signed)
LVM for patient to call back [phone], or to call PCP office to schedule follow up apt. AS, CMA  

## 2023-11-06 ENCOUNTER — Ambulatory Visit (INDEPENDENT_AMBULATORY_CARE_PROVIDER_SITE_OTHER): Payer: Medicaid Other | Admitting: Obstetrics & Gynecology

## 2023-11-06 ENCOUNTER — Encounter: Payer: Self-pay | Admitting: Obstetrics & Gynecology

## 2023-11-06 ENCOUNTER — Other Ambulatory Visit (HOSPITAL_COMMUNITY)
Admission: RE | Admit: 2023-11-06 | Discharge: 2023-11-06 | Disposition: A | Discharge status: Home / Self Care | Payer: Medicaid Other | Source: Ambulatory Visit | Attending: Obstetrics & Gynecology | Admitting: Obstetrics & Gynecology

## 2023-11-06 VITALS — BP 127/82 | HR 90 | Ht 64.0 in | Wt 304.0 lb

## 2023-11-06 DIAGNOSIS — Z113 Encounter for screening for infections with a predominantly sexual mode of transmission: Secondary | ICD-10-CM | POA: Insufficient documentation

## 2023-11-06 DIAGNOSIS — Z01419 Encounter for gynecological examination (general) (routine) without abnormal findings: Secondary | ICD-10-CM | POA: Diagnosis present

## 2023-11-06 DIAGNOSIS — Z1151 Encounter for screening for human papillomavirus (HPV): Secondary | ICD-10-CM

## 2023-11-06 DIAGNOSIS — N939 Abnormal uterine and vaginal bleeding, unspecified: Secondary | ICD-10-CM | POA: Diagnosis not present

## 2023-11-06 NOTE — Progress Notes (Signed)
GYNECOLOGY ANNUAL PREVENTATIVE CARE ENCOUNTER NOTE  History:     Elizabeth [name] is a 23 y.o. G29P0010 female here for a routine annual gynecologic exam.  Current complaints: irregular menstrual bleeding, had two periods in 11/24 and 12/24, but reported taking Plan B around that time.  Had some irregular menses prior to this, worried about PCOS. Not interested in birth control, hormonal management at this time. Sexually active with female partner, uses condoms.  Denies current abnormal vaginal discharge, pelvic pain, problems with intercourse or other gynecologic concerns.    Gynecologic History Patient's last menstrual period was 10/30/2023 (exact date). Contraception: condoms Unsure if she had HPV vaccine series  Obstetric History OB History  Gravida Para Term Preterm AB Living  1 0 0 0 1 0  SAB IAB Ectopic Multiple Live Births  1 0 0 0     # Outcome Date GA Lbr Len/2nd Weight Sex Type Anes PTL Lv  1 SAB 10/2021            Past Medical History:  Diagnosis Date   Asthma    IBS (irritable bowel syndrome)    Scoliosis     Past Surgical History:  Procedure Laterality Date   TONSILLECTOMY     TYMPANOSTOMY TUBE PLACEMENT  07/07/2006   Bilateral myringotomy tubes per OP chart note.    Current Outpatient Medications on File Prior to Visit  Medication Sig Dispense Refill   benzonatate (TESSALON) 100 MG capsule Take 1 capsule (100 mg total) by mouth 3 (three) times daily as needed. 30 capsule 0   fluticasone (FLONASE) 50 MCG/ACT nasal spray Place 2 sprays into both nostrils daily. 16 g 6   ondansetron (ZOFRAN-ODT) 4 MG disintegrating tablet Take 1 tablet (4 mg total) by mouth every 8 (eight) hours as needed for nausea or vomiting. 8 tablet 0   No current facility-administered medications on file prior to visit.    No Known Allergies  Social History:  reports that she has never smoked. She has never used smokeless tobacco. She reports that she does not currently use alcohol. She  reports that she does not currently use drugs.  Family History  Problem Relation Age of Onset   Asthma Mother    Asthma Maternal Uncle    Hypertension Maternal Grandmother    Cancer Maternal Grandmother    Asthma Maternal Grandmother    Heart attack Maternal Grandfather    Cancer Other        Breast    The following portions of the patient's history were reviewed and updated as appropriate: allergies, current medications, past family history, past medical history, past social history, past surgical history and problem list.  Review of Systems Pertinent items noted in HPI and remainder of comprehensive ROS otherwise negative.  Physical Exam:  BP 127/82   Pulse 90   Ht 5\' 4"  (1.626 m)   Wt (!) 304 lb (137.9 kg)   LMP 10/30/2023 (Exact Date)   BMI 52.18 kg/m  CONSTITUTIONAL: Well-developed, well-nourished female in no acute distress.  HENT:  Normocephalic, atraumatic, External right and left ear normal.  EYES: Conjunctivae and EOM are normal. Pupils are equal, round, and reactive to light. No scleral icterus.  NECK: Normal range of motion, supple, no masses.  Normal thyroid.  SKIN: Skin is warm and dry. No rash noted. Not diaphoretic. No erythema. No pallor. MUSCULOSKELETAL: Normal range of motion. No tenderness.  No cyanosis, clubbing, or edema.   NEUROLOGIC: Alert and oriented to person, place,  and time. Normal reflexes, muscle tone coordination. No cranial nerve deficit noted. PSYCHIATRIC: Normal mood and affect. Normal behavior. Normal judgment and thought content. CARDIOVASCULAR: Normal heart rate noted, regular rhythm RESPIRATORY: Clear to auscultation bilaterally. Effort and breath sounds normal, no problems with respiration noted. BREASTS: Symmetric in size. No masses, tenderness, skin changes, nipple drainage, or lymphadenopathy bilaterally.  Performed in the presence of a chaperone. ABDOMEN: Soft, obese, no distention appreciated.  No tenderness, rebound or guarding.   PELVIC: Normal appearing external genitalia and urethral meatus; normal appearing vaginal mucosa and cervix.  Normal appearing discharge.  Pap smear obtained.  Unable to palpate uterus or adnexa secondary to habitus.  Performed in the presence of a chaperone.   Assessment and Plan:    1. Abnormal uterine bleeding (AUB) Patient has abnormal uterine bleeding . She has a normal exam, no evidence of lesions.  Will order abnormal uterine bleeding evaluation labs and pelvic ultrasound to evaluate for any structural gynecologic abnormalities.  Patient to follow up after these results for further evaluation/management. I did discuss correlation of obesity and AUB, increased risk of endometrial pathology, so weight loss was recommended as a first line therapy. - CBC - Follicle stimulating hormone - Hemoglobin A1c - Comprehensive metabolic panel - Beta hCG quant (ref lab) - Testosterone,Free and Total - TSH Rfx on Abnormal to Free T4 - DHEA-sulfate - Anti mullerian hormone - US PELVIC COMPLETE WITH TRANSVAGINAL; Future  2. Routine screening for STI (sexually transmitted infection) STI screen done, will follow up results and manage accordingly. - Cytology - PAP - RPR+HBsAg+HCVAb+HIV  3. Well woman exam with routine gynecological exam (Primary) - Cytology - PAP Will follow up results of pap smear and manage accordingly. Normal breast examination today, she was advised to perform periodic self breast examinations.  Routine preventative health maintenance measures emphasized. Please refer to After Visit Summary for other counseling recommendations.      Jaynie Collins, MD, FACOG Obstetrician & Gynecologist, [facility] for [employer], [facility]

## 2023-11-07 ENCOUNTER — Encounter: Payer: Self-pay | Admitting: Obstetrics & Gynecology

## 2023-11-07 DIAGNOSIS — R7303 Prediabetes: Secondary | ICD-10-CM | POA: Insufficient documentation

## 2023-11-07 HISTORY — DX: Morbid (severe) obesity due to excess calories: E66.01

## 2023-11-07 LAB — COMPREHENSIVE METABOLIC PANEL
ALT: 27 [IU]/L (ref 0–32)
AST: 20 [IU]/L (ref 0–40)
Albumin: 4.4 g/dL (ref 4.0–5.0)
Alkaline Phosphatase: 91 [IU]/L (ref 44–121)
BUN/Creatinine Ratio: 15 (ref 9–23)
BUN: 12 mg/dL (ref 6–20)
Bilirubin Total: 0.4 mg/dL (ref 0.0–1.2)
CO2: 19 mmol/L — ABNORMAL LOW (ref 20–29)
Calcium: 9.5 mg/dL (ref 8.7–10.2)
Chloride: 103 mmol/L (ref 96–106)
Creatinine, Ser: 0.81 mg/dL (ref 0.57–1.00)
Globulin, Total: 2.6 g/dL (ref 1.5–4.5)
Glucose: 109 mg/dL — ABNORMAL HIGH (ref 70–99)
Potassium: 4.4 mmol/L (ref 3.5–5.2)
Sodium: 138 mmol/L (ref 134–144)
Total Protein: 7 g/dL (ref 6.0–8.5)
eGFR: 106 mL/min/{1.73_m2} (ref >59)

## 2023-11-07 LAB — RPR+HBSAG+HCVAB+...
HIV Screen 4th Generation wRfx: NONREACTIVE
Hep C Virus Ab: NONREACTIVE
Hepatitis B Surface Ag: NEGATIVE
RPR Ser Ql: NONREACTIVE

## 2023-11-07 LAB — CBC
Hematocrit: 44.1 % (ref 34.0–46.6)
Hemoglobin: 14.7 g/dL (ref 11.1–15.9)
MCH: 29.6 pg (ref 26.6–33.0)
MCHC: 33.3 g/dL (ref 31.5–35.7)
MCV: 89 fL (ref 79–97)
Platelets: 413 10*3/uL (ref 150–450)
RBC: 4.96 x10E6/uL (ref 3.77–5.28)
RDW: 13 % (ref 11.7–15.4)
WBC: 9.1 10*3/uL (ref 3.4–10.8)

## 2023-11-07 LAB — FOLLICLE STIMULATING HORMONE: FSH: 7 m[IU]/mL

## 2023-11-07 LAB — HEMOGLOBIN A1C
Est. average glucose Bld gHb Est-mCnc: 117 mg/dL
Hgb A1c MFr Bld: 5.7 % — ABNORMAL HIGH (ref 4.8–5.6)

## 2023-11-07 LAB — TSH RFX ON ABNORMAL TO FREE T4: TSH: 2.44 u[IU]/mL (ref 0.450–4.500)

## 2023-11-07 LAB — BETA HCG QUANT (REF LAB): hCG Quant: <1 m[IU]/mL

## 2023-11-07 LAB — DHEA-SULFATE: DHEA-SO4: 213 ug/dL (ref 110.0–431.7)

## 2023-11-08 LAB — TESTOSTERONE,FREE AND TOTAL
Testosterone, Free: 2.3 pg/mL (ref 0.0–4.2)
Testosterone: 33 ng/dL (ref 13–71)

## 2023-11-10 ENCOUNTER — Encounter: Payer: Self-pay | Admitting: Obstetrics & Gynecology

## 2023-11-12 ENCOUNTER — Encounter: Payer: Self-pay | Admitting: Obstetrics & Gynecology

## 2023-11-12 ENCOUNTER — Ambulatory Visit
Admission: RE | Admit: 2023-11-12 | Discharge: 2023-11-12 | Disposition: A | Discharge status: Home / Self Care | Payer: Medicaid Other | Source: Ambulatory Visit | Attending: Obstetrics & Gynecology | Admitting: Obstetrics & Gynecology

## 2023-11-12 DIAGNOSIS — R87612 Low grade squamous intraepithelial lesion on cytologic smear of cervix (LGSIL): Secondary | ICD-10-CM | POA: Insufficient documentation

## 2023-11-12 DIAGNOSIS — N939 Abnormal uterine and vaginal bleeding, unspecified: Secondary | ICD-10-CM | POA: Diagnosis present

## 2023-11-12 LAB — CYTOLOGY - PAP
Chlamydia: NEGATIVE
Comment: NEGATIVE
Comment: NEGATIVE
Comment: NORMAL
Neisseria Gonorrhea: NEGATIVE
Trichomonas: NEGATIVE

## 2023-11-12 LAB — ANTI MULLERIAN HORMONE: ANTI-MULLERIAN HORMONE (AMH): 2.48 ng/mL

## 2023-11-13 ENCOUNTER — Encounter: Payer: Self-pay | Admitting: Obstetrics & Gynecology

## 2023-11-17 ENCOUNTER — Ambulatory Visit: Payer: Medicaid Other | Admitting: Family Medicine

## 2023-11-18 ENCOUNTER — Telehealth: Payer: Self-pay | Admitting: *Deleted

## 2023-11-18 NOTE — Telephone Encounter (Signed)
-----   Message from [facility] sent at 11/17/2023  1:19 PM EST ----- Patient is reqesting call back to discuss her results.  She states she left a message on 11/09/23 for call back.

## 2023-11-18 NOTE — Telephone Encounter (Signed)
Attempted to call pt back. Left message pt can send a mychart message or call the office back. And I will try to call back this afternoon

## 2023-12-09 ENCOUNTER — Ambulatory Visit (INDEPENDENT_AMBULATORY_CARE_PROVIDER_SITE_OTHER): Payer: Medicaid Other | Admitting: Obstetrics & Gynecology

## 2023-12-09 ENCOUNTER — Encounter: Payer: Self-pay | Admitting: Obstetrics & Gynecology

## 2023-12-09 VITALS — BP 138/84 | HR 94 | Wt 304.2 lb

## 2023-12-09 DIAGNOSIS — E282 Polycystic ovarian syndrome: Secondary | ICD-10-CM | POA: Diagnosis not present

## 2023-12-09 DIAGNOSIS — R7303 Prediabetes: Secondary | ICD-10-CM

## 2023-12-09 MED ORDER — METFORMIN HCL 1000 MG PO TABS: ORAL_TABLET | ORAL | 5 refills | Status: DC

## 2023-12-09 NOTE — Progress Notes (Signed)
 GYNECOLOGY OFFICE VISIT NOTE  History:   Elizabeth [name] is a 23 y.o. G1P0010 here today for follow up of AUB.  Here with mother.  Reports having period earlier this month.. She denies any abnormal vaginal discharge, bleeding, pelvic pain or other concerns.    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    The following portions of the patient's history were reviewed and updated as appropriate: allergies, current medications, past family history, past medical history, past social history, past surgical history and problem list.   Health Maintenance:  LGSIL pap on 11/06/2023.   Review of Systems:  Pertinent items noted in HPI and remainder of comprehensive ROS otherwise negative.  Physical Exam:  BP 138/84   Pulse 94   Wt (!) 304 lb 3.2 oz (138 kg)   LMP 12/02/2023 (Exact Date)   BMI 52.22 kg/m  CONSTITUTIONAL: Well-developed, well-nourished female in no acute distress.  HEENT:  Normocephalic, atraumatic. External right and left ear normal. No scleral icterus.  NECK: Normal range of motion, supple, no masses noted on observation SKIN: No rash noted. Not diaphoretic. No erythema. No pallor. MUSCULOSKELETAL: Normal range of motion. No edema noted. NEUROLOGIC: Alert and oriented to person, place, and time. Normal muscle tone coordination. No cranial nerve deficit noted. PSYCHIATRIC: Normal mood and affect. Normal behavior. Normal judgment and thought content. CARDIOVASCULAR: Normal heart rate noted RESPIRATORY: Effort and  breath sounds normal, no problems with respiration noted ABDOMEN: No masses noted. No other overt distention noted.   PELVIC: Deferred  Labs and Imaging Recent Results (from the past 2160 hours)  Cytology - PAP     Status: Abnormal   Collection Time: 11/06/23  9:51 AM  Result Value Ref Range   Neisseria Gonorrhea Negative    Chlamydia Negative    Trichomonas Negative    Adequacy      Satisfactory for evaluation; transformation zone component PRESENT.   Diagnosis - Low grade squamous intraepithelial lesion (LSIL) (A)    Comment Normal Reference Range Trichomonas - Negative    Comment Normal Reference Ranger Chlamydia - Negative    Comment      Normal Reference Range Neisseria Gonorrhea - Negative  RPR+HBsAg+HCVAb+...     Status: None   Collection Time: 11/06/23 10:18 AM  Result Value Ref Range   Hepatitis B Surface Ag Negative Negative   Hep C Virus Ab Non Reactive Non Reactive    Comment: HCV antibody alone does not differentiate between previously resolved infection and active infection. Equivocal and Reactive HCV antibody results should be followed up with an HCV RNA test to support the diagnosis of active HCV infection.    RPR Ser Ql Non Reactive Non Reactive   HIV Screen 4th Generation wRfx Non Reactive Non Reactive    Comment: HIV-1/HIV-2 antibodies and HIV-1 p24 antigen were NOT detected. There is no laboratory evidence of HIV infection. HIV Negative   CBC     Status: None   Collection Time: 11/06/23 10:18 AM  Result Value  Ref Range   WBC 9.1 3.4 - 10.8 x10E3/uL   RBC 4.96 3.77 - 5.28 x10E6/uL   Hemoglobin 14.7 11.1 - 15.9 g/dL   Hematocrit 16.1 09.6 - 46.6 %   MCV 89 79 - 97 fL   MCH 29.6 26.6 - 33.0 pg   MCHC 33.3 31.5 - 35.7 g/dL   RDW 04.5 40.9 - 81.1 %   Platelets 413 150 - 450 x10E3/uL  Follicle stimulating hormone     Status: None   Collection Time: 11/06/23 10:18 AM  Result Value Ref Range   FSH 7.0 mIU/mL    Comment:                      Adult Female              Range                       Follicular phase      3.5 -  12.5                       Ovulation phase       4.7 -  21.5                       Luteal phase          1.7 -   7.7                       Postmenopausal       25.8 - 134.8   Hemoglobin A1c     Status: Abnormal   Collection Time: 11/06/23 10:18 AM  Result Value Ref Range   Hgb A1c MFr Bld 5.7 (H) 4.8 - 5.6 %    Comment:          Prediabetes: 5.7 - 6.4          Diabetes: >6.4          Glycemic control for adults with diabetes: <7.0    Est. average glucose Bld gHb Est-mCnc 117 mg/dL  Beta hCG quant (ref lab)     Status: None   Collection Time: 11/06/23 10:18 AM  Result Value Ref Range   hCG Quant <1 mIU/mL    Comment:                      Female (Non-pregnant)    0 -     5                             (Postmenopausal)  0 -     8                      Female (Pregnant)                      Weeks of Gestation                              3                6 -    71                              4  10 -   750                              5              217 -  7138                              6              158 - 31795                              7             [id number]                              8            32065 -149571                              9            63803 -[id number]                             10            46509 -186977                             12            27832 -[id number]                             14            13950 - 62530                             15            12039 - 70971                             16             9040 - 56451                             17             8175 - 55868                             18             8099 - 58176 Roche E CLIA methodology   DHEA-sulfate     Status: None   Collection Time: 11/06/23 10:18 AM  Result Value Ref Range   DHEA-SO4 213.0 110.0 - 431.7 ug/dL  Comprehensive metabolic panel     Status: Abnormal   Collection Time: 11/06/23  10:18 AM  Result Value Ref Range   Glucose 109 (H) 70 - 99 mg/dL   BUN 12 6 -  20 mg/dL   Creatinine, Ser 1.61 0.57 - 1.00 mg/dL   eGFR 096 >04 VW/UJW/1.19   BUN/Creatinine Ratio 15 9 - 23   Sodium 138 134 - 144 mmol/L   Potassium 4.4 3.5 - 5.2 mmol/L   Chloride 103 96 - 106 mmol/L   CO2 19 (L) 20 - 29 mmol/L   Calcium 9.5 8.7 - 10.2 mg/dL   Total Protein 7.0 6.0 - 8.5 g/dL   Albumin 4.4 4.0 - 5.0 g/dL   Globulin, Total 2.6 1.5 - 4.5 g/dL   Bilirubin Total 0.4 0.0 - 1.2 mg/dL   Alkaline Phosphatase 91 44 - 121 IU/L   AST 20 0 - 40 IU/L   ALT 27 0 - 32 IU/L  TSH Rfx on Abnormal to Free T4     Status: None   Collection Time: 11/06/23 10:30 AM  Result Value Ref Range   TSH 2.440 0.450 - 4.500 uIU/mL  Anti mullerian hormone     Status: None   Collection Time: 11/06/23 10:31 AM  Result Value Ref Range   ANTI-MULLERIAN HORMONE (AMH) 2.48 ng/mL    Comment: For assays employing antibodies, the possibility exists for interference by heterophile antibodies in the samples.1  1.Kricka L.  Interferences in Immunoassays - still a threat.  Clin. Chem. 2000; 46: 1478-2956.  This test was developed and its performance characteristics determined by [facility]. It has not been cleared or approved by the Food and Drug Administration. Reference Range: Females 20 - 25y: 1.23 - 11.51 Median  4.70 AMH concentrations of >= 1.06 ng/mL is correlated with a better response to ovarian stimulation, produced more retrievable oocytes and higher odds of live birth according to Gleicher et al.  Garlon Hatchet and Sterility. 2010: 21:3086-5784.  The current AMH test method correlates with the study method with a slope of 0.94. Females at risk of ovarian hyperstimulation syndrome or polycystic ovarian syndrome (PCOS) may exhibit elevated serum AMH concentrations.   AMH levels from PCOS patients may be 2 to 5 fold higher than age-appropriate reference i nterval values. Granulosa cell tumors of the ovary may  secrete AMH along with other tumor markers.  Elevated AMH is not specific for malignancy, and the assay should not be used exclusively to diagnose or exclude an AMH-secreting ovarian tumor.   Testosterone,Free and Total     Status: None   Collection Time: 11/06/23 10:32 AM  Result Value Ref Range   Testosterone 33 13 - 71 ng/dL   Testosterone, Free 2.3 0.0 - 4.2 pg/mL    US PELVIC COMPLETE WITH TRANSVAGINAL Result Date: 11/12/2023 : PROCEDURE: US PELVIS COMPLETE WITH TRANSVAGINAL HISTORY: Patient is a 23 y/o F with abnormal uterine bleeding. LMP 10/28/2023. COMPARISON: CT AP 12/18/2022, 02/08/2022, U/S pelvis 10/18/2021. TECHNIQUE: Two-dimensional transabdominal grayscale and color Doppler ultrasound of the pelvis was performed. Transvaginal was also performed. FINDINGS: The uterus is anteverted in position and measures 6.4 x 4.1 x 2.4 cm. It demonstrates a normal, homogeneous echotexture. The endometrium measures 0.7 cm and demonstrates a normal homogeneous echotexture. Multiple nabothian cysts are noted. The right ovary is not visualized. The left ovary measures 1.9 x 1.9 x 1.6 cm (3 mL) and demonstrates multiple peripheral follicles often visualized with polycystic ovarian syndrome. There is normal color Doppler flow. There is no fluid present within the cul-de-sac. IMPRESSION: 1. Unremarkable ultrasound of the pelvis. The right ovary is not visualized. Thank you for allowing [place] to assist in the care of this patient. Electronically Signed   By: Fayrene Fearing  Sluss M.D.   On: 11/12/2023 20:08      Assessment and Plan:     1. Prediabetes (Primary) 2. Polycystic left ovary All results reviewed, patient had a polycystic left ovary noted.  Discussed management of irregular periods in the setting of possible PCOS and with her prediabetes, recommended weight loss which helps with restoring ovulatory cycles, decreases glucose intolerance with improvement of metabolic risk, improves fertility/pregnancy rates  and helps with overall health.  Even modest weight loss (5 to 10 percent reduction in body weight) in women with PCOS may result in these effects.  Also discussed OCPs which are also the mainstay of pharmacologic therapy for women with PCOS for managing hyperandrogenism and menstrual dysfunction and for providing contraception; she does not want this.   Discussed Metformin management and this could help her AUB and also slow progression of prediabetes into diabetes.  Over 50% of PCOS patients on 1500mg  of Metformin daily have been shown to ovulate successfully. Common side effects include GI intolerance, kidney and liver enzyme irregularities, lactic acidosis.  Patient had normal CMET.  She will also be instructed to stop therapy if she anticipates major stresses, such as surgery or IVF, in order to avoid lactic acidosis. She was prescribed Metformin 500 mg po daily x 1 week, then bid x 1 week, 1000 mg po bid. Will continue to follow.  Patient will return in 2 months for followup and repeat CMET check. Bleeding precautions reviewed. - metFORMIN (GLUCOPHAGE) 1000 MG tablet; Take 1/2 tablet by mouth daily for one week. Then increase to one tablet daily for one week.  Then one tablet twice a day.  Dispense: 60 tablet; Refill: 5   Routine preventative health maintenance measures emphasized. Please refer to After Visit Summary for other counseling recommendations.   Return in about 2 months (around 02/08/2024) for Follow up AUB (on Metformin).    I spent 30 minutes dedicated to the care of this patient including pre-visit review of records, face to face time with the patient discussing her conditions and treatments, post visit ordering of medications and appropriate tests or procedures, coordinating care and documenting this visit encounter.    Jaynie Collins, MD, FACOG Obstetrician & Gynecologist, [facility] for [employer], [facility]

## 2024-02-10 ENCOUNTER — Encounter: Payer: Self-pay | Admitting: Obstetrics & Gynecology

## 2024-02-10 ENCOUNTER — Ambulatory Visit: Admitting: Obstetrics & Gynecology

## 2024-02-10 VITALS — BP 138/83 | HR 87

## 2024-02-10 DIAGNOSIS — E282 Polycystic ovarian syndrome: Secondary | ICD-10-CM | POA: Diagnosis not present

## 2024-02-10 DIAGNOSIS — R7303 Prediabetes: Secondary | ICD-10-CM | POA: Diagnosis not present

## 2024-02-10 NOTE — Progress Notes (Signed)
 GYNECOLOGY OFFICE VISIT NOTE  History:   Elizabeth [name] is a 23 y.o. G1P0010 here today for follow up after initiation of Metformin  for PCOS management on 12/09/2023.  Reported taking 500 mg daily for one week.  But then took 1000 mg dose and had a hour episode of vomiting, concerned she might have had a GI bug too, but she did not restart this. Had some loose stools too.  Wants to try again with Metformin , wants to know if that is okay. Reports having normal period last month. She denies any abnormal vaginal discharge, bleeding, pelvic pain or other concerns.    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   The following portions of the patient's history were reviewed and updated as appropriate: allergies, current medications, past family history, past medical history, past social history, past surgical history and problem list.   Health Maintenance:  LGSIL pap on 11/06/2023.   Review of Systems:  Pertinent items noted in HPI and remainder of comprehensive ROS otherwise negative.  Physical Exam:  BP 138/83   Pulse 87   LMP 01/27/2024  CONSTITUTIONAL: Well-developed, well-nourished female in no acute distress.  HEENT:  Normocephalic, atraumatic. External right and left ear normal. No scleral icterus.  NECK: Normal range of motion, supple, no masses noted on observation SKIN: No rash noted. Not diaphoretic. No erythema. No pallor. MUSCULOSKELETAL: Normal range of motion. No edema noted. NEUROLOGIC: Alert and oriented to person,  place, and time. Normal muscle tone coordination. No cranial nerve deficit noted. PSYCHIATRIC: Normal mood and affect. Normal behavior. Normal judgment and thought content. CARDIOVASCULAR: Normal heart rate noted RESPIRATORY: Effort and breath sounds normal, no problems with respiration noted ABDOMEN: No masses noted. No other overt distention noted.   PELVIC: Deferred     Assessment and Plan:     1. Prediabetes (Primary) 2. Polycystic ovarian syndrome Patient advised to try again, can do Metformin  500 mg po bid.  If she has recurrent vomiting or other symptoms again, she can stop this and can consider other options (may have to discuss other DM medications with PCP as I do not have experience prescribing anything else for PCOS indication).  If she tolerates the 500 mg bid, can consider increasing to 750 mg or 1000 mg bid. Patient will return in 1 month for followup and repeat CMET check.  Advised to continue weight loss efforts as previously counseled: weight loss which helps with restoring ovulatory cycles, decreases glucose intolerance with improvement of metabolic risk, improves fertility/pregnancy rates and helps with overall health.  Even modest weight loss (5 to 10 percent reduction in body weight) in women with PCOS may result in these effects.   Routine preventative health maintenance measures emphasized. Please refer to After Visit Summary for other counseling recommendations.   Return in about 1 month (around 03/12/2024) for Follow up PCOS.    I spent 30 minutes dedicated to the care of this patient including pre-visit review of records, face to face time with the patient  discussing her conditions and treatments, post visit ordering of medications and appropriate tests or procedures, coordinating care and documenting this visit encounter.    Lenoard Rad, MD, FACOG Obstetrician & Gynecologist, [facility] for [employer], [facility]

## 2024-04-05 ENCOUNTER — Ambulatory Visit: Admitting: Obstetrics & Gynecology

## 2024-04-19 ENCOUNTER — Other Ambulatory Visit: Payer: Self-pay | Admitting: Medical Genetics

## 2024-04-26 ENCOUNTER — Encounter: Payer: Self-pay | Admitting: Family Medicine

## 2024-04-26 ENCOUNTER — Ambulatory Visit (INDEPENDENT_AMBULATORY_CARE_PROVIDER_SITE_OTHER): Payer: Medicaid Other | Admitting: Family Medicine

## 2024-04-26 VITALS — BP 134/92 | HR 82 | Temp 98.1°F | Ht 63.75 in | Wt 302.0 lb

## 2024-04-26 DIAGNOSIS — Z789 Other specified health status: Secondary | ICD-10-CM | POA: Insufficient documentation

## 2024-04-26 DIAGNOSIS — E282 Polycystic ovarian syndrome: Secondary | ICD-10-CM | POA: Insufficient documentation

## 2024-04-26 DIAGNOSIS — Z23 Encounter for immunization: Secondary | ICD-10-CM | POA: Insufficient documentation

## 2024-04-26 DIAGNOSIS — R7303 Prediabetes: Secondary | ICD-10-CM | POA: Diagnosis not present

## 2024-04-26 MED ORDER — METFORMIN HCL ER 500 MG PO TB24
500.0000 mg | ORAL_TABLET | Freq: Every day | ORAL | 3 refills | Status: AC
Start: 2024-04-26 — End: ?

## 2024-04-26 MED ORDER — MUPIROCIN 2 % EX OINT: 1.0000 | TOPICAL_OINTMENT | Freq: Two times a day (BID) | CUTANEOUS | 0 refills | Status: DC

## 2024-04-26 NOTE — Patient Instructions (Addendum)
 Keep piercing very clean -soap /water / antiseptic of choice   Use bactroban  ointment 1-2 time daily  If not improved - take out the ring   Try metformin  xr 500 mg daily  If nausea or other side effects let me know   Tetanus shot today    Follow up in 3 months

## 2024-04-26 NOTE — Progress Notes (Signed)
 Subjective:    Patient ID: Elizabeth [name], female    DOB: 2001-06-27, 23 y.o.   MRN: [id number]  HPI  Wt Readings from Last 3 Encounters:  04/26/24 (!) 302 lb (137 kg)  12/09/23 (!) 304 lb 3.2 oz (138 kg)  11/06/23 (!) 304 lb (137.9 kg)   52.25 kg/m  Vitals:   04/26/24 1106  BP: (!) 134/92  Pulse: 82  Temp: 98.1 F (36.7 C)  SpO2: 99%    Pt presents to establish for primary care  Pcos Prediabetes Piercing infection?  Due for Tdap  Prevention saw Powell Lessen at [place] [facility] at [facility] (she moved)  Gyn Dr Dr Herchel  Her family comes here   PCOS Infected piercing?     Tetanus shot 10/2012   LGSIL pap 10/2023 - will repeat co testing at a year  Dx with PCOS  Irregular menses   Was on OC years ago    Morbid obesity  Bmi of 52.2   Prediabetes Lab Results  Component Value Date   HGBA1C 5.7 (H) 11/06/2023   Was on metformin  - was prescription 1000 mg - half pill twice daily   Tried it once- took 500 and tolerated it  Then went up to 1000 mg (day 2 or 3)- had nausea and vomiting (still unsure)    Works at [employer]  Not healthy eater Fast food every day  Works long hours -on feet  No extra exercise       Other last labs Lab Results  Component Value Date   NA 138 11/06/2023   K 4.4 11/06/2023   CO2 19 (L) 11/06/2023   GLUCOSE 109 (H) 11/06/2023   BUN 12 11/06/2023   CREATININE 0.81 11/06/2023   CALCIUM 9.5 11/06/2023   EGFR 106 11/06/2023   GFRNONAA >60 12/18/2022   Lab Results  Component Value Date   ALT 27 11/06/2023   AST 20 11/06/2023   ALKPHOS 91 11/06/2023   BILITOT 0.4 11/06/2023   Lab Results  Component Value Date   WBC 9.1 11/06/2023   HGB 14.7 11/06/2023   HCT 44.1 11/06/2023   MCV 89 11/06/2023   PLT 413 11/06/2023   No results found for: CHOL, HDL, LDLCALC, LDLDIRECT, TRIG, CHOLHDL    Patient Active Problem List   Diagnosis Date Noted   PCOS (polycystic ovarian syndrome)  04/26/2024   Body piercing 04/26/2024   Need for immunization against tetanus alone 04/26/2024   Polycystic left ovary 12/09/2023   LGSIL on Pap smear of cervix on 11/06/23 11/12/2023   Morbid obesity (HCC) 11/07/2023   Prediabetes    Irregular periods/menstrual cycles 10/18/2021   Past Medical History:  Diagnosis Date   Asthma    Episode of moderate major depression (HCC) 05/22/2021   IBS (irritable bowel syndrome)    Morbid obesity (HCC) 11/07/2023   Other secondary scoliosis, 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   Social History   Tobacco Use   Smoking status: Never   Smokeless tobacco: Never  Vaping Use   Vaping status: Every Day  Substance Use Topics   Alcohol use: Not Currently   Drug use: Not Currently   Family History  Problem Relation Age  of Onset   Asthma Mother    Asthma Maternal Uncle    Hypertension Maternal Grandmother    Cancer Maternal Grandmother    Asthma Maternal Grandmother    Heart attack Maternal Grandfather    Cancer Other        Breast   No Known Allergies No current outpatient medications on file prior to visit.   No current facility-administered medications on file prior to visit.    Review of Systems  Constitutional:  Negative for activity change, appetite change, fatigue, fever and unexpected weight change.  HENT:  Negative for congestion, ear pain, rhinorrhea, sinus pressure and sore throat.   Eyes:  Negative for pain, redness and visual disturbance.  Respiratory:  Negative for cough, shortness of breath and wheezing.   Cardiovascular:  Negative for chest pain and palpitations.  Gastrointestinal:  Negative for abdominal pain, blood in stool, constipation and diarrhea.   Endocrine: Negative for polydipsia and polyuria.  Genitourinary:  Negative for dysuria, frequency and urgency.  Musculoskeletal:  Negative for arthralgias, back pain and myalgias.  Skin:  Negative for pallor and rash.       Piercing in umbilicus is red  Allergic/Immunologic: Negative for environmental allergies.  Neurological:  Negative for dizziness, syncope and headaches.  Hematological:  Negative for adenopathy. Does not bruise/bleed easily.  Psychiatric/Behavioral:  Negative for decreased concentration and dysphoric mood. The patient is not nervous/anxious.        Objective:   Physical Exam Constitutional:      General: She is not in acute distress.    Appearance: Normal appearance. She is well-developed. She is obese. She is not ill-appearing or diaphoretic.  HENT:     Head: Normocephalic and atraumatic.  Eyes:     Conjunctiva/sclera: Conjunctivae normal.     Pupils: Pupils are equal, round, and reactive to light.  Neck:     Thyroid: No thyromegaly.     Vascular: No carotid bruit or JVD.  Cardiovascular:     Rate and Rhythm: Normal rate and regular rhythm.     Heart sounds: Normal heart sounds.     No gallop.  Pulmonary:     Effort: Pulmonary effort is normal. No respiratory distress.     Breath sounds: Normal breath sounds. No wheezing or rales.  Abdominal:     General: There is no distension or abdominal bruit.     Palpations: Abdomen is soft.  Musculoskeletal:     Cervical back: Normal range of motion and neck supple.     Right lower leg: No edema.     Left lower leg: No edema.  Lymphadenopathy:     Cervical: No cervical adenopathy.  Skin:    General: Skin is warm and dry.     Coloration: Skin is not pale.     Findings: Erythema present. No rash.     Comments: 0.5 cm of erythema surrounding umbilicus piercing  Scant swelling No drainage Some scale  Neurological:     Mental Status: She is alert.     Coordination: Coordination normal.     Deep Tendon  Reflexes: Reflexes are normal and symmetric. Reflexes normal.  Psychiatric:        Mood and Affect: Mood normal.           Assessment & Plan:   Problem List Items Addressed This Visit       Endocrine   PCOS (polycystic ovarian syndrome)   Reviewed gyn notes and plan  Pt did not tolerate 1000 mg metformin   Will try metformin  xr 500 mg daily  After 3 mo if does well consider increase dose Lab Results  Component Value Date   HGBA1C 5.7 (H) 11/06/2023    Sees gyn  May want to consider OC  Mild hirsute features  Consider spironolactone in future if needs blood pressure control         Other   Prediabetes - Primary   Lab Results  Component Value Date   HGBA1C 5.7 (H) 11/06/2023    Trying metformin   May tolerate XR better - will start 500 mg daily Follow up 3 mo  Pt is honest about lack of motivation for diet change or exercise at this time        Need for immunization against tetanus alone   Pt will get tetanus update at health dept We cannot give imms with medicaid currently  She voiced understanding      Morbid obesity (HCC)   No doubt harder to loose weight due to PCOS However not yet motivated to change lifestyle When ready advised:  Discussed how this problem influences overall health and the risks it imposes  Reviewed plan for weight loss with lower calorie diet (via better food choices (lower glycemic and portion control) along with exercise building up to or more than 30 minutes 5 days per week including some aerobic activity and strength training         Relevant Medications   metFORMIN  (GLUCOPHAGE -XR) 500 MG 24 hr tablet   Body piercing   Umbilicus piercing with some swelling/ erythema Unsure if metal reaction (did change to titanium just in case) or early infection  Reassuring exam   Encouraged to clean 1-2 times per day Sent bactroban  ointment to use bid If not improved will need to remove ring  Update if not starting to improve in a  week or if worsening  Call back and Er precautions noted in detail today

## 2024-04-26 NOTE — Assessment & Plan Note (Signed)
 Umbilicus piercing with some swelling/ erythema Unsure if metal reaction (did change to titanium just in case) or early infection  Reassuring exam   Encouraged to clean 1-2 times per day Sent bactroban  ointment to use bid If not improved will need to remove ring  Update if not starting to improve in a week or if worsening  Call back and Er precautions noted in detail today

## 2024-04-26 NOTE — Assessment & Plan Note (Signed)
 Pt will get tetanus update at health dept We cannot give imms with medicaid currently  She voiced understanding

## 2024-04-26 NOTE — Assessment & Plan Note (Signed)
 Lab Results  Component Value Date   HGBA1C 5.7 (H) 11/06/2023    Trying metformin   May tolerate XR better - will start 500 mg daily Follow up 3 mo  Pt is honest about lack of motivation for diet change or exercise at this time

## 2024-04-26 NOTE — Assessment & Plan Note (Addendum)
 Reviewed gyn notes and plan  Pt did not tolerate 1000 mg metformin   Will try metformin  xr 500 mg daily  After 3 mo if does well consider increase dose Lab Results  Component Value Date   HGBA1C 5.7 (H) 11/06/2023    Sees gyn  May want to consider OC  Mild hirsute features  Consider spironolactone in future if needs blood pressure control

## 2024-04-26 NOTE — Assessment & Plan Note (Signed)
 No doubt harder to loose weight due to PCOS However not yet motivated to change lifestyle When ready advised:  Discussed how this problem influences overall health and the risks it imposes  Reviewed plan for weight loss with lower calorie diet (via better food choices (lower glycemic and portion control) along with exercise building up to or more than 30 minutes 5 days per week including some aerobic activity and strength training

## 2024-07-16 ENCOUNTER — Other Ambulatory Visit: Payer: Self-pay | Admitting: Medical Genetics

## 2024-07-16 DIAGNOSIS — Z006 Encounter for examination for normal comparison and control in clinical research program: Secondary | ICD-10-CM

## 2024-08-02 ENCOUNTER — Ambulatory Visit: Admitting: Family Medicine

## 2024-09-28 ENCOUNTER — Ambulatory Visit: Payer: Self-pay

## 2024-09-28 ENCOUNTER — Telehealth: Admitting: Family Medicine

## 2024-09-28 DIAGNOSIS — H6690 Otitis media, unspecified, unspecified ear: Secondary | ICD-10-CM

## 2024-09-28 NOTE — Progress Notes (Signed)
" °  Because Ms. Elizabeth [name], I feel your condition warrants further evaluation and I recommend that you be seen in a face-to-face visit.   NOTE: There will be NO CHARGE for this E-Visit   If you are having a true medical emergency, please call 911.     For an urgent face to face visit, [place] has multiple urgent care centers for your convenience.  Click the link below for the full list of locations and hours, walk-in wait times, appointment scheduling options and driving directions:  Urgent Care - [place], [place], [place], [place], [place], [place]  [place]     Your MyChart E-visit questionnaire answers were reviewed by a board certified advanced clinical practitioner to complete your personal care plan based on your specific symptoms.    Thank you for using e-Visits.    "

## 2024-09-28 NOTE — Telephone Encounter (Signed)
 FYI Only or Action Required?: FYI only for provider: appointment scheduled on 09/29/24.  Patient was last seen in primary care on 04/26/2024 by Randeen Laine LABOR, MD.  Called Nurse Triage reporting Ear Drainage.  Symptoms began several months ago.  Interventions attempted: Nothing.  Symptoms are: gradually worsening.  Triage Disposition: See Physician Within 24 Hours  Patient/caregiver understands and will follow disposition?: Yes    Copied from CRM #[id number]. Topic: Clinical - Red Word Triage >> Sep 28, 2024  4:26 PM Elizabeth [name] wrote: Red Word that prompted transfer to Nurse Triage: Hearing Loss in right ear/red drainage, head/nasal congestion, sore throat, left ear hearing loss/drainage and popping     Reason for Disposition  Ear pain  Answer Assessment - Initial Assessment Questions Pt called in for care advice for clear drainage from bilateral ears. Pt reports having extensive hx of ear surgeries and infections. Reports having T tubes placed via surgery over a decade ago. Pt was seen via virtual UC provider who recommended f/u in person for assessment. Pt reports R eardrum has a hole that drains to her throat so in the past, ear drops have not been successful. Reports drainage in R ear since July. Pt reports pain and fullness in L ear. Pt reports ear popping and worsening tinnitus. Discussed UC for quick assessment and eval vs. Eval in office. Pt provided with red flag symptoms: imbalance, cloudy/bloody drainage from ears or decrease in hearing which would warrant immediate medical attention. Appointment scheduled for evaluation. Patient agrees with plan of care, and will call back if anything changes, or if symptoms worsen.     1. LOCATION: Which ear is involved?      Bilateral   2. COLOR: What is the color of the discharge?      Clear   4. ONSET: When did you first notice the discharge?     Present for several weeks   5. PAIN: Is there any earache? How bad is it?   (Scale 0-10; none, mild, moderate or severe)     Yes pain in L ear and fullness sensation   7. OTHER SYMPTOMS: Do you have any other symptoms? (e.g., headache, fever, dizziness, vomiting, runny nose)     Denies fever, n/v  Protocols used: Ear - Discharge-A-AH

## 2024-09-29 ENCOUNTER — Ambulatory Visit (INDEPENDENT_AMBULATORY_CARE_PROVIDER_SITE_OTHER)

## 2024-09-29 VITALS — BP 112/86 | HR 113 | Temp 98.7°F | Ht 63.75 in | Wt 304.0 lb

## 2024-09-29 DIAGNOSIS — H9203 Otalgia, bilateral: Secondary | ICD-10-CM

## 2024-09-29 DIAGNOSIS — B9689 Other specified bacterial agents as the cause of diseases classified elsewhere: Secondary | ICD-10-CM | POA: Diagnosis not present

## 2024-09-29 DIAGNOSIS — J329 Chronic sinusitis, unspecified: Secondary | ICD-10-CM | POA: Diagnosis not present

## 2024-09-29 DIAGNOSIS — J069 Acute upper respiratory infection, unspecified: Secondary | ICD-10-CM

## 2024-09-29 MED ORDER — AMOXICILLIN 875 MG PO TABS: 875.0000 mg | ORAL_TABLET | Freq: Two times a day (BID) | ORAL | 0 refills | Status: AC

## 2024-09-29 MED ORDER — OXYMETAZOLINE HCL 0.05 % NA SOLN: 1.0000 | Freq: Two times a day (BID) | NASAL | 0 refills | Status: AC

## 2024-09-29 MED ORDER — IBUPROFEN 800 MG PO TABS: 800.0000 mg | ORAL_TABLET | Freq: Three times a day (TID) | ORAL | 0 refills | Status: AC | PRN

## 2024-09-29 NOTE — Progress Notes (Signed)
 "  Subjective:   This visit was conducted in person. The patient gave informed consent to the use of Abridge AI technology to record the contents of the encounter as documented below.   Patient ID: Elizabeth [name], female    DOB: [birth year], 23 y.o.   MRN: [id number]   Discussed the use of AI scribe software for clinical note transcription with the patient, who gave verbal consent to proceed.  History of Present Illness Elizabeth [name] is a 23 year old female who presents with congestion, ear pain, and body aches.  Symptoms began a week ago with congestion, ear pain, and a feeling of fullness. Her left ear popped yesterday, temporarily affecting her hearing, but it has improved today. She has a history of ear infections and has been experiencing a sore throat for about a week, which was worse before but is better today.  She began experiencing body aches and weakness last night, followed by dizziness and nausea. She feels feverish with chills, although she has not measured a fever at home. Her boyfriend has checked her head for fever, and she has experienced fluctuations in temperature, feeling very hot and then very cold.  She has a mild cough and difficulty breathing due to congestion, but no chest pain. She has been experiencing frontal headaches, which have been intermittent for about a week, coinciding with the congestion.  Regarding her ear issues, she has had a chronic ear infection in her right ear since July, with a perforated eardrum that prevents the effective use of ear drops. She has experienced clear and yellow drainage, and recently reddish drainage that resembled blood. The left ear issues began with the onset of her cold symptoms, including a popping sensation and temporary hearing loss.  She has been around several sick individuals, including family, her boss, and her boyfriend. She does not use earphones or earbuds and tries to keep her ears dry during showers.      Review of  Systems  All other systems reviewed and are negative.       Allergies[1]  Medications Ordered Prior to Encounter[2]  BP 112/86 (BP Location: Left Arm, Patient Position: Sitting, Cuff Size: Large)   Pulse (!) 113   Temp 98.7 F (37.1 C) (Oral)   Ht 5' 3.75 (1.619 m)   Wt (!) 304 lb (137.9 kg)   LMP 09/21/2024 (Exact Date)   SpO2 97%   BMI 52.59 kg/m   Objective:      Physical Exam VITALS: T- 98.7 GENERAL: Alert, cooperative, well developed, no acute distress. HEAD: Normocephalic atraumatic. EARS: Tympanic membrane not bulging or red, ear canal not red or swollen, white discharge in L ear canal. NOSE: No congestion or rhinorrhea, mucous membranes are moist. THROAT: No oropharyngeal exudate or posterior oropharyngeal erythema. CARDIOVASCULAR: Normal heart rate and rhythm, S1 and S2 normal without murmurs. CHEST: Clear to auscultation bilaterally, no wheezes, rhonchi, or crackles. NECK: No abnormalities noted.       Assessment & Plan:   Assessment & Plan Acute bacterial sinusitis Symptoms persisted for over a week with worsening congestion, suggesting bacterial cause. No fever, but feeling feverish and chills present. Treated with antibiotics due to symptom duration and severity. - Prescribed amoxicillin  500 mg, one tablet twice a day for 7 days. - Advised to take amoxicillin  with food to minimize gastrointestinal side effects. - Continue Afrin nasal spray, one spray in each nostril twice a day for 3 days. - Continue ibuprofen  800 mg, one tablet every 8  hours as needed for body aches.   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 Mild discharge noted in the left ear, patient however declines otic drops citing that Previous ear drops were ineffective.  Will see if symptoms respond to course of amoxicillin .  - Advised to keep ears dry during showers.     Return for worsening of symptoms or failure to improve.   Navdeep Fessenden K Javen Hinderliter, MD   09/29/2024     Contains text generated by Abridge.        [1] No Known Allergies [2]  Current Outpatient Medications on File Prior to Visit  Medication Sig Dispense Refill   metFORMIN  (GLUCOPHAGE -XR) 500 MG 24 hr tablet Take 1 tablet (500 mg total) by mouth daily with breakfast. 30 tablet 3   No current facility-administered medications on file prior to visit.   "

## 2024-09-29 NOTE — Patient Instructions (Signed)
 Thank you for visiting  Healthcare today! Here's what we talked about: - START Amoxicillin , advil  and afrin for symptoms - Appointment with PCP if not improved after 7 days

## 2024-09-29 NOTE — Telephone Encounter (Signed)
 FYI to Dr. Bennett who is seeing pt for sxs today
